# Patient Record
Sex: Male | Born: 1950 | Race: White | Hispanic: No | Marital: Married | Smoking: Never smoker
Health system: Southern US, Community
[De-identification: ages and names within clinical notes are randomized; demographics above are authoritative.]

## PROBLEM LIST (undated history)

## (undated) DIAGNOSIS — F419 Anxiety disorder, unspecified: Secondary | ICD-10-CM

## (undated) DIAGNOSIS — M1990 Unspecified osteoarthritis, unspecified site: Secondary | ICD-10-CM

## (undated) DIAGNOSIS — T8453XA Infection and inflammatory reaction due to internal right knee prosthesis, initial encounter: Secondary | ICD-10-CM

## (undated) DIAGNOSIS — L111 Transient acantholytic dermatosis [Grover]: Secondary | ICD-10-CM

## (undated) DIAGNOSIS — L853 Xerosis cutis: Secondary | ICD-10-CM

## (undated) DIAGNOSIS — K219 Gastro-esophageal reflux disease without esophagitis: Secondary | ICD-10-CM

## (undated) HISTORY — DX: Infection and inflammatory reaction due to internal right knee prosthesis, initial encounter: T84.53XA

## (undated) HISTORY — PX: SHOULDER SURGERY: SHX246

## (undated) HISTORY — PX: COLONOSCOPY: SHX174

## (undated) HISTORY — PX: HIP SURGERY: SHX245

## (undated) HISTORY — PX: VASECTOMY: SHX75

## (undated) HISTORY — DX: Transient acantholytic dermatosis (grover): L11.1

---
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
## 1989-01-20 HISTORY — PX: BACK SURGERY: SHX140

## 1998-08-28 ENCOUNTER — Ambulatory Visit (HOSPITAL_COMMUNITY): Admission: RE | Admit: 1998-08-28 | Discharge: 1998-08-28 | Payer: Self-pay | Admitting: Neurosurgery

## 1998-08-28 ENCOUNTER — Encounter: Payer: Self-pay | Admitting: Neurosurgery

## 1998-11-09 ENCOUNTER — Encounter: Admission: RE | Admit: 1998-11-09 | Discharge: 1999-02-07 | Payer: Self-pay | Admitting: Anesthesiology

## 2013-03-04 ENCOUNTER — Other Ambulatory Visit: Payer: Self-pay | Admitting: Orthopedic Surgery

## 2013-03-04 DIAGNOSIS — M179 Osteoarthritis of knee, unspecified: Secondary | ICD-10-CM

## 2013-03-04 DIAGNOSIS — M1710 Unilateral primary osteoarthritis, unspecified knee: Secondary | ICD-10-CM

## 2013-03-11 ENCOUNTER — Ambulatory Visit
Admission: RE | Admit: 2013-03-11 | Discharge: 2013-03-11 | Disposition: A | Discharge status: Home / Self Care | Payer: Commercial Managed Care - HMO | Source: Ambulatory Visit | Attending: Orthopedic Surgery | Admitting: Orthopedic Surgery

## 2013-03-11 DIAGNOSIS — M179 Osteoarthritis of knee, unspecified: Secondary | ICD-10-CM

## 2013-03-11 DIAGNOSIS — M1710 Unilateral primary osteoarthritis, unspecified knee: Secondary | ICD-10-CM

## 2013-04-06 HISTORY — PX: MEDIAL PARTIAL KNEE REPLACEMENT: SHX5965

## 2013-04-21 ENCOUNTER — Other Ambulatory Visit: Payer: Self-pay | Admitting: Orthopedic Surgery

## 2013-04-21 NOTE — Pre-Procedure Instructions (Signed)
RYKIN ROUTE  04/21/2013   Your procedure is scheduled on:  Saturday, April 23, 2013 at 7:30 AM.   Report to [facility] Emergency Registration at 6:00 AM.   Call this number if you have problems the morning of surgery: [phone]   Remember:   Do not eat food or drink liquids after midnight tonight.   Take these medicines the morning of surgery with A SIP OF WATER:    Do not wear jewelry, make-up or nail polish.  Do not wear lotions, powders, or perfumes. You may wear deodorant.  Do not shave 48 hours prior to surgery. Men may shave face and neck.  Do not bring valuables to the hospital.  [facility] is not responsible                  for any belongings or valuables.               Contacts, dentures or bridgework may not be worn into surgery.  Leave suitcase in the car. After surgery it may be brought to your room.  For patients admitted to the hospital, discharge time is determined by your                treatment team.               Patients discharged the day of surgery will not be allowed to drive  home.  Name and phone number of your driver: Family/friend   Special Instructions: [place] - Preparing for Surgery  Before surgery, you can play an important role.  Because skin is not sterile, your skin needs to be as free of germs as possible.  You can reduce the number of germs on you skin by washing with CHG (chlorahexidine gluconate) soap before surgery.  CHG is an antiseptic cleaner which kills germs and bonds with the skin to continue killing germs even after washing.  Please DO NOT use if you have an allergy to CHG or antibacterial soaps.  If your skin becomes reddened/irritated stop using the CHG and inform your nurse when you arrive at Short Stay.  Do not shave (including legs and underarms) for at least 48 hours prior to the first CHG shower.  You may shave your face.  Please follow these instructions carefully:   1.  Shower with CHG Soap the night before  surgery and the                                morning of Surgery.  2.  If you choose to wash your hair, wash your hair first as usual with your       normal shampoo.  3.  After you shampoo, rinse your hair and body thoroughly to remove the                      Shampoo.  4.  Use CHG as you would any other liquid soap.  You can apply chg directly       to the skin and wash gently with scrungie or a clean washcloth.  5.  Apply the CHG Soap to your body ONLY FROM THE NECK DOWN.        Do not use on open wounds or open sores.  Avoid contact with your eyes, ears, mouth and genitals (private parts).  Wash genitals (private parts) with your normal soap.  6.  Wash thoroughly, paying special attention to the area where your surgery        will be performed.  7.  Thoroughly rinse your body with warm water from the neck down.  8.  DO NOT shower/wash with your normal soap after using and rinsing off       the CHG Soap.  9.  Pat yourself dry with a clean towel.            10.  Wear clean pajamas.            11.  Place clean sheets on your bed the night of your first shower and do not        sleep with pets.  Day of Surgery  Do not apply any lotions the morning of surgery.  Please wear clean clothes to the hospital/surgery center.     Please read over the following fact sheets that you were given: Pain Booklet, Coughing and Deep Breathing and Surgical Site Infection Prevention

## 2013-04-22 ENCOUNTER — Encounter (HOSPITAL_COMMUNITY)
Admission: RE | Admit: 2013-04-22 | Discharge: 2013-04-22 | Disposition: A | Discharge status: Home / Self Care | Payer: Medicare HMO | Source: Ambulatory Visit | Attending: Orthopedic Surgery | Admitting: Orthopedic Surgery

## 2013-04-22 ENCOUNTER — Encounter (HOSPITAL_COMMUNITY): Payer: Self-pay

## 2013-04-22 DIAGNOSIS — Z01812 Encounter for preprocedural laboratory examination: Secondary | ICD-10-CM

## 2013-04-22 HISTORY — DX: Unspecified osteoarthritis, unspecified site: M19.90

## 2013-04-22 HISTORY — DX: Xerosis cutis: L85.3

## 2013-04-22 HISTORY — DX: Gastro-esophageal reflux disease without esophagitis: K21.9

## 2013-04-22 HISTORY — DX: Anxiety disorder, unspecified: F41.9

## 2013-04-22 LAB — CBC
HCT: 40.7 % (ref 39.0–52.0)
HEMOGLOBIN: 14.9 g/dL (ref 13.0–17.0)
MCH: 32.6 pg (ref 26.0–34.0)
MCHC: 36.6 g/dL — AB (ref 30.0–36.0)
MCV: 89.1 fL (ref 78.0–100.0)
Platelets: 392 10*3/uL (ref 150–400)
RBC: 4.57 MIL/uL (ref 4.22–5.81)
RDW: 13 % (ref 11.5–15.5)
WBC: 10 10*3/uL (ref 4.0–10.5)

## 2013-04-22 LAB — BASIC METABOLIC PANEL
BUN: 12 mg/dL (ref 6–23)
CO2: 16 mEq/L — ABNORMAL LOW (ref 19–32)
Calcium: 9.4 mg/dL (ref 8.4–10.5)
Chloride: 97 mEq/L (ref 96–112)
Creatinine, Ser: 0.84 mg/dL (ref 0.50–1.35)
GFR calc Af Amer: >90 mL/min (ref >90)
GLUCOSE: 150 mg/dL — AB (ref 70–99)
POTASSIUM: 3.8 meq/L (ref 3.7–5.3)
Sodium: 133 mEq/L — ABNORMAL LOW (ref 137–147)

## 2013-04-22 NOTE — Pre-Procedure Instructions (Signed)
Ricky Davis  04/22/2013   Your procedure is scheduled on:  Saturday, April 23, 2013 at 7:30 AM.   Report to [facility] Emergency Registration at 6:00 AM.   Call this number if you have problems the morning of surgery: [phone]   Remember:   Do not eat food or drink liquids after midnight tonight.   Take these medicines the morning of surgery with A SIP OF WATER: FLUoxetine (PROZAC), oxyCODONE (OXY IR/ROXICODONE) - if needed.     Do not wear jewelry, make-up or nail polish.  Do not wear lotions, powders, or perfumes. You may wear deodorant.  Do not shave 48 hours prior to surgery. Men may shave face and neck.  Do not bring valuables to the hospital.  [facility] is not responsible                  for any belongings or valuables.               Contacts, dentures or bridgework may not be worn into surgery.  Leave suitcase in the car. After surgery it may be brought to your room.  For patients admitted to the hospital, discharge time is determined by your                treatment team.               Patients discharged the day of surgery will not be allowed to drive  home.  Name and phone number of your driver: Family/friend   Special Instructions: [place] - Preparing for Surgery  Before surgery, you can play an important role.  Because skin is not sterile, your skin needs to be as free of germs as possible.  You can reduce the number of germs on you skin by washing with CHG (chlorahexidine gluconate) soap before surgery.  CHG is an antiseptic cleaner which kills germs and bonds with the skin to continue killing germs even after washing.  Please DO NOT use if you have an allergy to CHG or antibacterial soaps.  If your skin becomes reddened/irritated stop using the CHG and inform your nurse when you arrive at Short Stay.  Do not shave (including legs and underarms) for at least 48 hours prior to the first CHG shower.  You may shave your face.  Please follow these  instructions carefully:   1.  Shower with CHG Soap the night before surgery and the                                morning of Surgery.  2.  If you choose to wash your hair, wash your hair first as usual with your       normal shampoo.  3.  After you shampoo, rinse your hair and body thoroughly to remove the                      Shampoo.  4.  Use CHG as you would any other liquid soap.  You can apply chg directly       to the skin and wash gently with scrungie or a clean washcloth.  5.  Apply the CHG Soap to your body ONLY FROM THE NECK DOWN.        Do not use on open wounds or open sores.  Avoid contact with your eyes, ears, mouth and genitals (private parts).  Wash  genitals (private parts) with your normal soap.  6.  Wash thoroughly, paying special attention to the area where your surgery        will be performed.  7.  Thoroughly rinse your body with warm water from the neck down.  8.  DO NOT shower/wash with your normal soap after using and rinsing off       the CHG Soap.  9.  Pat yourself dry with a clean towel.            10.  Wear clean pajamas.            11.  Place clean sheets on your bed the night of your first shower and do not        sleep with pets.  Day of Surgery  Do not apply any lotions the morning of surgery.  Please wear clean clothes to the hospital/surgery center.     Please read over the following fact sheets that you were given: Pain Booklet, Coughing and Deep Breathing and Surgical Site Infection Prevention

## 2013-04-22 NOTE — Progress Notes (Signed)
Pt arrived to PAT at 9 AM in severe pain, unable to sit still, feeling nauseated, shaking. States that his right knee is draining, wife changed dressing this AM around 7:30 and pant leg around knee is wet. Pt states he was not able to sleep last pm at all due to the pain. Took a Oxycodone before 7 AM and no relief from pain. Also states he's taken his Cipro which he started yesterday. I called Dr. Ronnie Derby who was in surgery here at the hospital and he stated that his PA would come by PAT and see pt. We were able to finish the PAT interview and pt was very uncomfortable the entire time. I gave him an ice pack and he sat in the recliner for a while and then back in the wheelchair. Linton Rump, [place] with Dr. Ronnie Derby came and instructed pt to come to the office as soon as pt was finished here and he would assess and possibly drain pt's knee. Pt and voiced understanding. Pt getting labs drawn and will go to Dr. Ruel Favors office from here.

## 2013-04-22 NOTE — Progress Notes (Signed)
Pt denies having any cardiac hx, chest pain or sob.  Pt's PCP is Dr. Chancy Milroy at [facility] in [place], [place]

## 2013-04-23 ENCOUNTER — Encounter (HOSPITAL_COMMUNITY): Payer: Medicare HMO | Admitting: Anesthesiology

## 2013-04-23 ENCOUNTER — Encounter (HOSPITAL_COMMUNITY): Payer: Self-pay | Admitting: *Deleted

## 2013-04-23 ENCOUNTER — Ambulatory Visit (HOSPITAL_COMMUNITY): Payer: Medicare HMO | Admitting: Anesthesiology

## 2013-04-23 ENCOUNTER — Inpatient Hospital Stay (HOSPITAL_COMMUNITY)
Admission: RE | Admit: 2013-04-23 | Discharge: 2013-04-25 | DRG: 560 | Disposition: A | Discharge status: Home Health | Payer: Medicare HMO | Source: Ambulatory Visit | Attending: Orthopedic Surgery | Admitting: Orthopedic Surgery

## 2013-04-23 ENCOUNTER — Encounter (HOSPITAL_COMMUNITY)
Admission: RE | Disposition: A | Discharge status: Home Health | Payer: Self-pay | Source: Ambulatory Visit | Attending: Orthopedic Surgery

## 2013-04-23 DIAGNOSIS — F1020 Alcohol dependence, uncomplicated: Secondary | ICD-10-CM

## 2013-04-23 DIAGNOSIS — D62 Acute posthemorrhagic anemia: Secondary | ICD-10-CM | POA: Diagnosis not present

## 2013-04-23 DIAGNOSIS — T8453XA Infection and inflammatory reaction due to internal right knee prosthesis, initial encounter: Secondary | ICD-10-CM | POA: Insufficient documentation

## 2013-04-23 DIAGNOSIS — Z96659 Presence of unspecified artificial knee joint: Secondary | ICD-10-CM

## 2013-04-23 DIAGNOSIS — A4901 Methicillin susceptible Staphylococcus aureus infection, unspecified site: Secondary | ICD-10-CM | POA: Diagnosis present

## 2013-04-23 DIAGNOSIS — Y831 Surgical operation with implant of artificial internal device as the cause of abnormal reaction of the patient, or of later complication, without mention of misadventure at the time of the procedure: Secondary | ICD-10-CM | POA: Diagnosis present

## 2013-04-23 DIAGNOSIS — M25569 Pain in unspecified knee: Secondary | ICD-10-CM | POA: Diagnosis present

## 2013-04-23 DIAGNOSIS — T8450XA Infection and inflammatory reaction due to unspecified internal joint prosthesis, initial encounter: Secondary | ICD-10-CM | POA: Diagnosis present

## 2013-04-23 DIAGNOSIS — F411 Generalized anxiety disorder: Secondary | ICD-10-CM | POA: Diagnosis present

## 2013-04-23 DIAGNOSIS — K219 Gastro-esophageal reflux disease without esophagitis: Secondary | ICD-10-CM | POA: Diagnosis present

## 2013-04-23 DIAGNOSIS — M129 Arthropathy, unspecified: Secondary | ICD-10-CM | POA: Diagnosis present

## 2013-04-23 DIAGNOSIS — L089 Local infection of the skin and subcutaneous tissue, unspecified: Secondary | ICD-10-CM

## 2013-04-23 DIAGNOSIS — Y849 Medical procedure, unspecified as the cause of abnormal reaction of the patient, or of later complication, without mention of misadventure at the time of the procedure: Secondary | ICD-10-CM

## 2013-04-23 DIAGNOSIS — L738 Other specified follicular disorders: Secondary | ICD-10-CM | POA: Diagnosis present

## 2013-04-23 DIAGNOSIS — Z113 Encounter for screening for infections with a predominantly sexual mode of transmission: Secondary | ICD-10-CM

## 2013-04-23 HISTORY — PX: I&D KNEE WITH POLY EXCHANGE: SHX5024

## 2013-04-23 LAB — GRAM STAIN

## 2013-04-23 SURGERY — IRRIGATION AND DEBRIDEMENT KNEE WITH POLY EXCHANGE
Anesthesia: General | Site: Knee | Laterality: Right

## 2013-04-23 MED ORDER — METOCLOPRAMIDE HCL 5 MG PO TABS
5.0000 mg | ORAL_TABLET | Freq: Three times a day (TID) | ORAL | Status: DC | PRN
  Filled 2013-04-23: qty 2

## 2013-04-23 MED ORDER — MIDAZOLAM HCL 5 MG/5ML IJ SOLN
INTRAMUSCULAR | Status: DC | PRN
  Administered 2013-04-23: 2 mg via INTRAVENOUS

## 2013-04-23 MED ORDER — BISACODYL 5 MG PO TBEC
5.0000 mg | DELAYED_RELEASE_TABLET | Freq: Every day | ORAL | Status: DC | PRN
  Administered 2013-04-24: 5 mg via ORAL
  Filled 2013-04-23: qty 1

## 2013-04-23 MED ORDER — CEFAZOLIN SODIUM-DEXTROSE 2-3 GM-% IV SOLR
2.0000 g | Freq: Four times a day (QID) | INTRAVENOUS | Status: AC
  Administered 2013-04-23 (×2): 2 g via INTRAVENOUS
  Filled 2013-04-23 (×2): qty 50

## 2013-04-23 MED ORDER — METOCLOPRAMIDE HCL 5 MG/ML IJ SOLN: 5.0000 mg | Freq: Three times a day (TID) | INTRAMUSCULAR | Status: DC | PRN

## 2013-04-23 MED ORDER — SODIUM CHLORIDE 0.9 % IR SOLN
Status: DC | PRN
  Administered 2013-04-23: 5000 mL

## 2013-04-23 MED ORDER — MENTHOL 3 MG MT LOZG: 1.0000 | LOZENGE | OROMUCOSAL | Status: DC | PRN

## 2013-04-23 MED ORDER — DOCUSATE SODIUM 100 MG PO CAPS
100.0000 mg | ORAL_CAPSULE | Freq: Two times a day (BID) | ORAL | Status: DC
  Administered 2013-04-23 – 2013-04-25 (×4): 100 mg via ORAL
  Filled 2013-04-23 (×4): qty 1

## 2013-04-23 MED ORDER — SODIUM CHLORIDE 0.9 % IJ SOLN
INTRAMUSCULAR | Status: AC
  Filled 2013-04-23: qty 10

## 2013-04-23 MED ORDER — CHLORHEXIDINE GLUCONATE 4 % EX LIQD
60.0000 mL | Freq: Once | CUTANEOUS | Status: DC
  Filled 2013-04-23: qty 60

## 2013-04-23 MED ORDER — METHOCARBAMOL 100 MG/ML IJ SOLN
500.0000 mg | Freq: Four times a day (QID) | INTRAVENOUS | Status: DC | PRN
  Filled 2013-04-23: qty 5

## 2013-04-23 MED ORDER — METHOCARBAMOL 500 MG PO TABS
ORAL_TABLET | ORAL | Status: AC
  Administered 2013-04-23: 500 mg via ORAL
  Filled 2013-04-23: qty 1

## 2013-04-23 MED ORDER — ACETAMINOPHEN 325 MG PO TABS
650.0000 mg | ORAL_TABLET | Freq: Four times a day (QID) | ORAL | Status: DC | PRN
Start: 2013-04-23 — End: 2013-04-25
  Administered 2013-04-23: 650 mg via ORAL

## 2013-04-23 MED ORDER — OXYCODONE HCL 5 MG PO TABS
5.0000 mg | ORAL_TABLET | ORAL | Status: DC | PRN
  Administered 2013-04-23 – 2013-04-25 (×7): 10 mg via ORAL
  Filled 2013-04-23 (×6): qty 2

## 2013-04-23 MED ORDER — SUFENTANIL CITRATE 50 MCG/ML IV SOLN
INTRAVENOUS | Status: AC
  Filled 2013-04-23: qty 1

## 2013-04-23 MED ORDER — DEXTROSE 5 % IV SOLN
3.0000 g | INTRAVENOUS | Status: AC
  Administered 2013-04-23: 3 g via INTRAVENOUS
  Filled 2013-04-23: qty 3000

## 2013-04-23 MED ORDER — ASPIRIN EC 325 MG PO TBEC
325.0000 mg | DELAYED_RELEASE_TABLET | Freq: Every day | ORAL | Status: DC
  Administered 2013-04-24 – 2013-04-25 (×2): 325 mg via ORAL
  Filled 2013-04-23 (×3): qty 1

## 2013-04-23 MED ORDER — OXYCODONE HCL 5 MG PO TABS
ORAL_TABLET | ORAL | Status: AC
  Administered 2013-04-23: 10 mg via ORAL
  Filled 2013-04-23: qty 2

## 2013-04-23 MED ORDER — TRIAMCINOLONE ACETONIDE 0.1 % EX CREA
1.0000 "application " | TOPICAL_CREAM | Freq: Two times a day (BID) | CUTANEOUS | Status: DC | PRN
  Filled 2013-04-23: qty 15

## 2013-04-23 MED ORDER — ACETAMINOPHEN 325 MG PO TABS
ORAL_TABLET | ORAL | Status: AC
  Administered 2013-04-23: 650 mg via ORAL
  Filled 2013-04-23: qty 2

## 2013-04-23 MED ORDER — PROPOFOL 10 MG/ML IV BOLUS
INTRAVENOUS | Status: AC
  Filled 2013-04-23: qty 20

## 2013-04-23 MED ORDER — PHENOL 1.4 % MT LIQD
1.0000 | OROMUCOSAL | Status: DC | PRN
Start: 2013-04-23 — End: 2013-04-25

## 2013-04-23 MED ORDER — ONDANSETRON HCL 4 MG/2ML IJ SOLN: 4.0000 mg | Freq: Four times a day (QID) | INTRAMUSCULAR | Status: DC | PRN

## 2013-04-23 MED ORDER — OXYCODONE HCL ER 10 MG PO T12A
10.0000 mg | EXTENDED_RELEASE_TABLET | Freq: Two times a day (BID) | ORAL | Status: DC
  Administered 2013-04-23 – 2013-04-25 (×5): 10 mg via ORAL
  Filled 2013-04-23 (×5): qty 1

## 2013-04-23 MED ORDER — PROPOFOL 10 MG/ML IV BOLUS
INTRAVENOUS | Status: DC | PRN
  Administered 2013-04-23: 200 mg via INTRAVENOUS

## 2013-04-23 MED ORDER — RIFAMPIN 300 MG PO CAPS
300.0000 mg | ORAL_CAPSULE | Freq: Two times a day (BID) | ORAL | Status: DC
  Administered 2013-04-23 – 2013-04-25 (×5): 300 mg via ORAL
  Filled 2013-04-23 (×6): qty 1

## 2013-04-23 MED ORDER — SUFENTANIL CITRATE 50 MCG/ML IV SOLN
INTRAVENOUS | Status: DC | PRN
  Administered 2013-04-23: 5 ug via INTRAVENOUS
  Administered 2013-04-23: 10 ug via INTRAVENOUS
  Administered 2013-04-23: 5 ug via INTRAVENOUS
  Administered 2013-04-23: 10 ug via INTRAVENOUS
  Administered 2013-04-23 (×4): 5 ug via INTRAVENOUS

## 2013-04-23 MED ORDER — HYDROMORPHONE HCL PF 1 MG/ML IJ SOLN
0.2500 mg | INTRAMUSCULAR | Status: DC | PRN
  Administered 2013-04-23 (×2): 0.5 mg via INTRAVENOUS

## 2013-04-23 MED ORDER — BUPIVACAINE LIPOSOME 1.3 % IJ SUSP
20.0000 mL | Freq: Once | INTRAMUSCULAR | Status: DC
  Filled 2013-04-23: qty 20

## 2013-04-23 MED ORDER — HYDROMORPHONE HCL PF 1 MG/ML IJ SOLN
INTRAMUSCULAR | Status: AC
  Administered 2013-04-23: 0.5 mg via INTRAVENOUS
  Filled 2013-04-23: qty 1

## 2013-04-23 MED ORDER — PHENYLEPHRINE 40 MCG/ML (10ML) SYRINGE FOR IV PUSH (FOR BLOOD PRESSURE SUPPORT)
PREFILLED_SYRINGE | INTRAVENOUS | Status: AC
  Filled 2013-04-23: qty 10

## 2013-04-23 MED ORDER — IBUPROFEN 800 MG PO TABS
800.0000 mg | ORAL_TABLET | Freq: Three times a day (TID) | ORAL | Status: DC
  Filled 2013-04-23 (×2): qty 1

## 2013-04-23 MED ORDER — TRANEXAMIC ACID 100 MG/ML IV SOLN
1000.0000 mg | INTRAVENOUS | Status: AC
  Administered 2013-04-23: 1000 mg via INTRAVENOUS
  Filled 2013-04-23: qty 10

## 2013-04-23 MED ORDER — ONDANSETRON HCL 4 MG/2ML IJ SOLN
INTRAMUSCULAR | Status: DC | PRN
  Administered 2013-04-23: 4 mg via INTRAVENOUS

## 2013-04-23 MED ORDER — DIPHENHYDRAMINE HCL 12.5 MG/5ML PO ELIX: 12.5000 mg | ORAL_SOLUTION | ORAL | Status: DC | PRN

## 2013-04-23 MED ORDER — MIDAZOLAM HCL 2 MG/2ML IJ SOLN
INTRAMUSCULAR | Status: AC
  Filled 2013-04-23: qty 2

## 2013-04-23 MED ORDER — SODIUM CHLORIDE 0.9 % IJ SOLN
10.0000 mL | INTRAMUSCULAR | Status: DC | PRN
  Administered 2013-04-24 – 2013-04-25 (×3): 10 mL

## 2013-04-23 MED ORDER — SENNOSIDES-DOCUSATE SODIUM 8.6-50 MG PO TABS: 1.0000 | ORAL_TABLET | Freq: Every evening | ORAL | Status: DC | PRN

## 2013-04-23 MED ORDER — FLEET ENEMA 7-19 GM/118ML RE ENEM: 1.0000 | ENEMA | Freq: Once | RECTAL | Status: AC | PRN

## 2013-04-23 MED ORDER — ACETAMINOPHEN 650 MG RE SUPP: 650.0000 mg | Freq: Four times a day (QID) | RECTAL | Status: DC | PRN

## 2013-04-23 MED ORDER — VANCOMYCIN HCL IN DEXTROSE 1-5 GM/200ML-% IV SOLN
1000.0000 mg | Freq: Two times a day (BID) | INTRAVENOUS | Status: DC
  Administered 2013-04-23 – 2013-04-24 (×3): 1000 mg via INTRAVENOUS
  Filled 2013-04-23 (×5): qty 200

## 2013-04-23 MED ORDER — SODIUM CHLORIDE 0.9 % IV SOLN: INTRAVENOUS | Status: DC

## 2013-04-23 MED ORDER — ALUM & MAG HYDROXIDE-SIMETH 200-200-20 MG/5ML PO SUSP: 30.0000 mL | ORAL | Status: DC | PRN

## 2013-04-23 MED ORDER — HYDROMORPHONE HCL PF 1 MG/ML IJ SOLN
1.0000 mg | INTRAMUSCULAR | Status: DC | PRN
  Administered 2013-04-24: 1 mg via INTRAVENOUS
  Filled 2013-04-23: qty 1

## 2013-04-23 MED ORDER — BUPIVACAINE-EPINEPHRINE 0.5% -1:200000 IJ SOLN
INTRAMUSCULAR | Status: DC | PRN
  Administered 2013-04-23: 30 mL

## 2013-04-23 MED ORDER — FLUOXETINE HCL 20 MG PO CAPS
40.0000 mg | ORAL_CAPSULE | Freq: Every day | ORAL | Status: DC
  Administered 2013-04-24 – 2013-04-25 (×2): 40 mg via ORAL
  Filled 2013-04-23 (×2): qty 2

## 2013-04-23 MED ORDER — METHOCARBAMOL 500 MG PO TABS
500.0000 mg | ORAL_TABLET | Freq: Four times a day (QID) | ORAL | Status: DC | PRN
  Administered 2013-04-23 – 2013-04-25 (×7): 500 mg via ORAL
  Filled 2013-04-23 (×5): qty 1

## 2013-04-23 MED ORDER — BUPIVACAINE-EPINEPHRINE (PF) 0.5% -1:200000 IJ SOLN
INTRAMUSCULAR | Status: AC
  Filled 2013-04-23: qty 10

## 2013-04-23 MED ORDER — PHENYLEPHRINE HCL 10 MG/ML IJ SOLN
INTRAMUSCULAR | Status: DC | PRN
  Administered 2013-04-23 (×2): 80 ug via INTRAVENOUS

## 2013-04-23 MED ORDER — SODIUM CHLORIDE 0.9 % IV SOLN
INTRAVENOUS | Status: DC
Start: 2013-04-23 — End: 2013-04-25
  Administered 2013-04-23: 11:00:00 via INTRAVENOUS

## 2013-04-23 MED ORDER — 0.9 % SODIUM CHLORIDE (POUR BTL) OPTIME
TOPICAL | Status: DC | PRN
  Administered 2013-04-23: 1000 mL

## 2013-04-23 MED ORDER — LIDOCAINE HCL (CARDIAC) 20 MG/ML IV SOLN
INTRAVENOUS | Status: DC | PRN
  Administered 2013-04-23: 60 mg via INTRAVENOUS

## 2013-04-23 MED ORDER — LACTATED RINGERS IV SOLN
INTRAVENOUS | Status: DC | PRN
  Administered 2013-04-23 (×2): via INTRAVENOUS

## 2013-04-23 MED ORDER — LIDOCAINE HCL (CARDIAC) 20 MG/ML IV SOLN
INTRAVENOUS | Status: AC
  Filled 2013-04-23: qty 5

## 2013-04-23 MED ORDER — BUPIVACAINE LIPOSOME 1.3 % IJ SUSP
INTRAMUSCULAR | Status: DC | PRN
  Administered 2013-04-23: 20 mL

## 2013-04-23 MED ORDER — VANCOMYCIN HCL IN DEXTROSE 1-5 GM/200ML-% IV SOLN
1000.0000 mg | Freq: Once | INTRAVENOUS | Status: AC
  Administered 2013-04-23: 1000 mg via INTRAVENOUS
  Filled 2013-04-23: qty 200

## 2013-04-23 MED ORDER — ONDANSETRON HCL 4 MG/2ML IJ SOLN
INTRAMUSCULAR | Status: AC
  Filled 2013-04-23: qty 2

## 2013-04-23 MED ORDER — ONDANSETRON HCL 4 MG PO TABS: 4.0000 mg | ORAL_TABLET | Freq: Four times a day (QID) | ORAL | Status: DC | PRN

## 2013-04-23 SURGICAL SUPPLY — 66 items
BAG URINE DRAINAGE (UROLOGICAL SUPPLIES) IMPLANT
BANDAGE ELASTIC 4 VELCRO ST LF (GAUZE/BANDAGES/DRESSINGS) ×2 IMPLANT
BANDAGE ELASTIC 6 VELCRO ST LF (GAUZE/BANDAGES/DRESSINGS) ×2 IMPLANT
BANDAGE ESMARK 6X9 LF (GAUZE/BANDAGES/DRESSINGS) ×1 IMPLANT
BNDG CMPR 9X6 STRL LF SNTH (GAUZE/BANDAGES/DRESSINGS) ×1
BNDG ESMARK 6X9 LF (GAUZE/BANDAGES/DRESSINGS) ×3
BOWL SMART MIX CTS (DISPOSABLE) ×1 IMPLANT
CATH KIT ON Q 5IN SLV (PAIN MANAGEMENT) ×1 IMPLANT
CLOSURE WOUND 1/2 X4 (GAUZE/BANDAGES/DRESSINGS) ×1
CONT SPEC 4OZ CLIKSEAL STRL BL (MISCELLANEOUS) ×2 IMPLANT
COVER SURGICAL LIGHT HANDLE (MISCELLANEOUS) ×3 IMPLANT
CUFF TOURNIQUET SINGLE 34IN LL (TOURNIQUET CUFF) IMPLANT
DRAPE EXTREMITY T 121X128X90 (DRAPE) ×3 IMPLANT
DRAPE INCISE IOBAN 66X45 STRL (DRAPES) ×2 IMPLANT
DRAPE PROXIMA HALF (DRAPES) ×3 IMPLANT
DRAPE U-SHAPE 47X51 STRL (DRAPES) ×3 IMPLANT
DRSG ADAPTIC 3X8 NADH LF (GAUZE/BANDAGES/DRESSINGS) ×1 IMPLANT
DRSG PAD ABDOMINAL 8X10 ST (GAUZE/BANDAGES/DRESSINGS) ×5 IMPLANT
DURAPREP 26ML APPLICATOR (WOUND CARE) ×4 IMPLANT
ELECT REM PT RETURN 9FT ADLT (ELECTROSURGICAL) ×3
ELECTRODE REM PT RTRN 9FT ADLT (ELECTROSURGICAL) ×1 IMPLANT
EVACUATOR 1/8 PVC DRAIN (DRAIN) ×3 IMPLANT
GAUZE XEROFORM 1X8 LF (GAUZE/BANDAGES/DRESSINGS) ×2 IMPLANT
GLOVE BIOGEL M 7.0 STRL (GLOVE) IMPLANT
GLOVE BIOGEL PI IND STRL 7.5 (GLOVE) IMPLANT
GLOVE BIOGEL PI IND STRL 8.5 (GLOVE) ×2 IMPLANT
GLOVE BIOGEL PI INDICATOR 7.5 (GLOVE)
GLOVE BIOGEL PI INDICATOR 8.5 (GLOVE) ×4
GLOVE SURG ORTHO 8.0 STRL STRW (GLOVE) ×6 IMPLANT
GOWN STRL REUS W/ TWL LRG LVL3 (GOWN DISPOSABLE) ×2 IMPLANT
GOWN STRL REUS W/ TWL XL LVL3 (GOWN DISPOSABLE) ×2 IMPLANT
GOWN STRL REUS W/TWL LRG LVL3 (GOWN DISPOSABLE) ×3
GOWN STRL REUS W/TWL XL LVL3 (GOWN DISPOSABLE) ×6
HANDPIECE INTERPULSE COAX TIP (DISPOSABLE) ×3
HOOD PEEL AWAY FACE SHEILD DIS (HOOD) ×6 IMPLANT
KIT BASIN OR (CUSTOM PROCEDURE TRAY) ×3 IMPLANT
KIT ROOM TURNOVER OR (KITS) ×3 IMPLANT
KNEE UNICOMPART ARTISURF 8MM (Knees) ×2 IMPLANT
MANIFOLD NEPTUNE II (INSTRUMENTS) ×3 IMPLANT
NDL 18GX1X1/2 (RX/OR ONLY) (NEEDLE) IMPLANT
NEEDLE 18GX1X1/2 (RX/OR ONLY) (NEEDLE) ×3 IMPLANT
NS IRRIG 1000ML POUR BTL (IV SOLUTION) ×3 IMPLANT
PACK TOTAL JOINT (CUSTOM PROCEDURE TRAY) ×3 IMPLANT
PAD ARMBOARD 7.5X6 YLW CONV (MISCELLANEOUS) ×6 IMPLANT
PADDING CAST COTTON 6X4 STRL (CAST SUPPLIES) ×3 IMPLANT
SET HNDPC FAN SPRY TIP SCT (DISPOSABLE) ×1 IMPLANT
SPONGE GAUZE 4X4 12PLY (GAUZE/BANDAGES/DRESSINGS) ×3 IMPLANT
STAPLER VISISTAT 35W (STAPLE) ×1 IMPLANT
STRIP CLOSURE SKIN 1/2X4 (GAUZE/BANDAGES/DRESSINGS) ×1 IMPLANT
SUCTION FRAZIER TIP 10 FR DISP (SUCTIONS) ×3 IMPLANT
SUT MNCRL AB 3-0 PS2 18 (SUTURE) ×2 IMPLANT
SUT PDS AB 1 CTX 36 (SUTURE) ×4 IMPLANT
SUT PDS AB 1 CTXB1 36 (SUTURE) IMPLANT
SUT VIC AB 0 CTB1 27 (SUTURE) ×4 IMPLANT
SUT VIC AB 1 CT1 27 (SUTURE)
SUT VIC AB 1 CT1 27XBRD ANBCTR (SUTURE) IMPLANT
SUT VIC AB 2-0 CT1 27 (SUTURE) ×3
SUT VIC AB 2-0 CT1 TAPERPNT 27 (SUTURE) ×2 IMPLANT
SYR 20CC LL (SYRINGE) ×6 IMPLANT
SYRINGE 10CC LL (SYRINGE) IMPLANT
TOWEL OR 17X24 6PK STRL BLUE (TOWEL DISPOSABLE) ×3 IMPLANT
TOWEL OR 17X26 10 PK STRL BLUE (TOWEL DISPOSABLE) ×3 IMPLANT
TRAY FOLEY CATH 16FRSI W/METER (SET/KITS/TRAYS/PACK) IMPLANT
TRAY TIBIAL INSERTER TIP (Orthopedic Implant) ×2 IMPLANT
UNICOMPARTMENTAL KNEE SYSTEM ×2 IMPLANT
WATER STERILE IRR 1000ML POUR (IV SOLUTION) ×3 IMPLANT

## 2013-04-23 NOTE — Op Note (Addendum)
NAMEJAXTYN, Ricky Davis NO.:  [id number]  MEDICAL RECORD NO.:  [id number]  LOCATION:  [facility]                         FACILITY:  [facility]  PHYSICIAN:  Estill Bamberg. Ronnie Derby, M.D. DATE OF BIRTH:  25-Oct-1950  DATE OF PROCEDURE:  04/23/2013 DATE OF DISCHARGE:                              OPERATIVE REPORT   SURGEON:  Estill Bamberg. Ronnie Derby, MD  ASSISTANT:  Carlynn Spry PA-C  PREOPERATIVE DIAGNOSIS:  Right knee infection.  POSTOPERATIVE DIAGNOSIS:  Right knee infection.  PROCEDURE:  Right knee open irrigation and debridement of uni knee.  INDICATION FOR PROCEDURE:  The patient is a 63 year old white male who is 2 weeks status post partial knee.  He had 119,000 white blood cells in his aspirate.  Informed consent was obtained after good cultures.  DESCRIPTION OF PROCEDURE:  The patient was laid supine, administered general anesthesia.  Right leg prepped and draped in usual fashion. There was no exsanguination of the extremity due to infection.  I then made the midline incision.  There was lot of purulence in the subQ tissue between skin and joint; infected in the prepatellar area.  I debrided this carefully with rongeur and #10 blade and forceps.  I then irrigated with about 1500 mL of L.R.  I then made the incision along the patella tendon where there was a communication with the joint.  I then opened that completely.  I washed that with another 1500 mL of normal saline via pulse lavage. I then closed the capsule with #1 PDS subcuticular 2-0 Monocryl.  Steri-Strips, Xeroform dressing sponges, sterile Webril, and Ace wrap.  Tourniquet time was 40 minutes.  COMPLICATIONS:  None.  DRAINS:  None.          ______________________________ Estill Bamberg. Ronnie Derby, M.D.     SDL/MEDQ  D:  04/23/2013  T:  04/23/2013  Job:  [id number]

## 2013-04-23 NOTE — Anesthesia Postprocedure Evaluation (Signed)
  Anesthesia Post-op Note  Patient: Ricky Davis  Procedure(s) Performed: Procedure(s): IRRIGATION AND DEBRIDEMENT KNEE WITH POLY EXCHANGE (Right)  Patient Location: PACU  Anesthesia Type:General  Level of Consciousness: awake  Airway and Oxygen Therapy: Patient Spontanous Breathing  Post-op Pain: mild  Post-op Assessment: Post-op Vital signs reviewed  Post-op Vital Signs: Reviewed  Complications: No apparent anesthesia complications

## 2013-04-23 NOTE — Anesthesia Procedure Notes (Signed)
Procedure Name: LMA Insertion Date/Time: 04/23/2013 7:38 AM Performed by: Izora Gala Pre-anesthesia Checklist: Patient identified, Emergency Drugs available, Suction available and Patient being monitored Patient Re-evaluated:Patient Re-evaluated prior to inductionOxygen Delivery Method: Circle system utilized Preoxygenation: Pre-oxygenation with 100% oxygen Intubation Type: IV induction Ventilation: Mask ventilation without difficulty LMA: LMA inserted LMA Size: 4.0 Number of attempts: 1 Placement Confirmation: positive ETCO2 and breath sounds checked- equal and bilateral Tube secured with: Tape Dental Injury: Teeth and Oropharynx as per pre-operative assessment

## 2013-04-23 NOTE — Anesthesia Preprocedure Evaluation (Addendum)
Anesthesia Evaluation  Patient identified by MRN, date of birth, ID band Patient awake    Reviewed: Allergy & Precautions, H&P , NPO status   History of Anesthesia Complications Negative for: history of anesthetic complications  Airway Mallampati: I TM Distance: >3 FB Neck ROM: Full   Comment: Beard and moustache Dental   Pulmonary neg pulmonary ROS,  breath sounds clear to auscultation  Pulmonary exam normal       Cardiovascular negative cardio ROS  Rhythm:Regular     Neuro/Psych negative neurological ROS  negative psych ROS   GI/Hepatic Neg liver ROS, GERD-  ,Occasional beer Controlled by head elevation   Endo/Other  negative endocrine ROS  Renal/GU negative Renal ROS     Musculoskeletal   Abdominal   Peds  Hematology negative hematology ROS (+)   Anesthesia Other Findings   Reproductive/Obstetrics                          Anesthesia Physical Anesthesia Plan  ASA: II  Anesthesia Plan: General   Post-op Pain Management:    Induction: Intravenous  Airway Management Planned: LMA  Additional Equipment:   Intra-op Plan:   Post-operative Plan: Extubation in OR  Informed Consent:   Dental advisory given  Plan Discussed with:   Anesthesia Plan Comments:         Anesthesia Quick Evaluation

## 2013-04-23 NOTE — Progress Notes (Signed)
OT Cancellation and Discharge Note  Patient Details Name: Ricky Davis MRN: [id number] DOB: 09-Nov-1950   Cancelled Treatment:    Reason Eval/Treat Not Completed: OT screened, no needs identified, will sign off. Pt s/p R TKA about 2 weeks ago, now in for I&D for infection. Pt educated on role of OT and pt has no ADL concerns. Pt has been performing ADLs independently or with help from wife for the past 2 weeks and feels confident he can return to doing so after discharge. Opportunity offered to practice transfers or LB dressing, however pt declined. No further acute OT needs at this time; acute OT to sign off.   Juluis Rainier [phone] 04/23/2013, 4:59 PM

## 2013-04-23 NOTE — Consult Note (Signed)
[facility] for Infectious Disease    Date of Admission:  04/23/2013  Date of Consult:  04/23/2013  Reason for Consult: Prosthetic joint infection Referring Physician: Dr. Ronnie Derby   HPI: Ricky Davis is an 63 y.o. male with recent TKA on the right who developed drainage from knee after having a few weeks of knee pain. Joint was aspirated and cell count with >100K wbc, PMN, predominant. Culture reportedly NGTD, though I am having trouble finding it in "care everywhere." sp I and D and poly-exchange today and with new cultures sent.   Past Medical History  Diagnosis Date  . Anxiety     takin Fluoxetine  . GERD (gastroesophageal reflux disease)     no longer taking meds, elevates HOB  . Arthritis   . Dry skin     "winter itch", using Kenalog cream    Past Surgical History  Procedure Laterality Date  . Medial partial knee replacement Right 04/06/2013  . Vasectomy    . Colonoscopy    . Back surgery  1991    lumbar surgery  . Hamstring surgery  1958    to stretch hamstring  ergies:   No Known Allergies   Medications: I have reviewed patients current medications as documented in Epic Anti-infectives   Start     Dose/Rate Route Frequency Ordered Stop   04/23/13 2200  vancomycin (VANCOCIN) IVPB 1000 mg/200 mL premix     1,000 mg 200 mL/hr over 60 Minutes Intravenous Every 12 hours 04/23/13 1236     04/23/13 1400  ceFAZolin (ANCEF) IVPB 2 g/50 mL premix     2 g 100 mL/hr over 30 Minutes Intravenous Every 6 hours 04/23/13 1053 04/24/13 0159   04/23/13 1330  rifampin (RIFADIN) capsule 300 mg     300 mg Oral Every 12 hours 04/23/13 1229     04/23/13 1315  vancomycin (VANCOCIN) IVPB 1000 mg/200 mL premix     1,000 mg 200 mL/hr over 60 Minutes Intravenous  Once 04/23/13 1236     04/23/13 0631  ceFAZolin (ANCEF) 3 g in dextrose 5 % 50 mL IVPB     3 g 160 mL/hr over 30 Minutes Intravenous On call to O.R. 04/23/13 0631 04/23/13 0803      Social History:  reports that he  has never smoked. He has never used smokeless tobacco. He reports that he drinks alcohol. He reports that he does not use illicit drugs.  Family History  Problem Relation Age of Onset  . Alzheimer's disease Mother   . Hypertension Mother   . Alzheimer's disease Father     As in HPI and primary teams notes otherwise 12 point review of systems is negative  Blood pressure 122/79, pulse 97, temperature 98 F (36.7 C), temperature source Oral, resp. rate 18, weight 158 lb (71.668 kg), SpO2 98.00%. General: Alert and awake, oriented x3, not in any acute distress. HEENT: anicteric sclera, , EOMI, oropharynx clear and without exudate CVS regular rate, normal r,  no murmur rubs or gallops Chest: clear to auscultation bilaterally, no wheezing, rales or rhonchi Abdomen: soft nontender, nondistended, normal bowel sounds, Extremities: knee bandaged Skin: no rashes Neuro: nonfocal, strength and sensation intact   Results for orders placed during the hospital encounter of 04/23/13 (from the past 48 hour(s))  GRAM STAIN     Status: None   Collection Time    04/23/13  8:03 AM      Result Value Ref Range   Specimen Description SYNOVIAL  RIGHT FLUID KNEE     Special Requests NONE     Gram Stain       Value: FEW WBC PRESENT, PREDOMINANTLY PMN     NO ORGANISMS SEEN   Report Status 04/23/2013 FINAL    GRAM STAIN     Status: None   Collection Time    04/23/13  8:03 AM      Result Value Ref Range   Specimen Description WOUND RIGHT KNEE     Special Requests NONE     Gram Stain       Value: ABUNDANT WBC PRESENT,BOTH PMN AND MONONUCLEAR     NO ORGANISMS SEEN   Report Status 04/23/2013 FINAL        Component Value Date/Time   SDES SYNOVIAL RIGHT FLUID KNEE 04/23/2013 0803   SDES WOUND RIGHT KNEE 04/23/2013 0803   SPECREQUEST NONE 04/23/2013 0803   SPECREQUEST NONE 04/23/2013 0803   REPTSTATUS 04/23/2013 FINAL 04/23/2013 0803   REPTSTATUS 04/23/2013 FINAL 04/23/2013 0803   No results found.   Recent  Results (from the past 720 hour(s))  GRAM STAIN     Status: None   Collection Time    04/23/13  8:03 AM      Result Value Ref Range Status   Specimen Description SYNOVIAL RIGHT FLUID KNEE   Final   Special Requests NONE   Final   Gram Stain     Final   Value: FEW WBC PRESENT, PREDOMINANTLY PMN     NO ORGANISMS SEEN   Report Status 04/23/2013 FINAL   Final  GRAM STAIN     Status: None   Collection Time    04/23/13  8:03 AM      Result Value Ref Range Status   Specimen Description WOUND RIGHT KNEE   Final   Special Requests NONE   Final   Gram Stain     Final   Value: ABUNDANT WBC PRESENT,BOTH PMN AND MONONUCLEAR     NO ORGANISMS SEEN   Report Status 04/23/2013 FINAL   Final     Impression/Recommendation  Active Problems:   Knee pain, acute   Ricky Davis is a 63 y.o. 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  #1 Prosthetic joint infection  --start vancomycin for goal trough of 15-20 --start rifampin 300mg  bid  Followup cultures (at [facility] and here from OR --was on abx just prior to OR cultures here )  PICC line   Will need 6 weeks of IV abx,followed by po abx for 6 months minimum  #2 Screening: check HIV and Hep C  #3 Etoh use: 3 beers per day, will need to abstain as much as possible with rifampin on board   04/23/2013, 12:53 PM   Thank you so much for this interesting consult  [facility] [phone] (pager) [phone] (office) 04/23/2013, 12:53 PM  [place] 04/23/2013, 12:53 PM

## 2013-04-23 NOTE — Progress Notes (Signed)
Pt lying in bed, denies pain or needs at present. No acute distress noted at this time. Will continue to monitor pt.

## 2013-04-23 NOTE — Transfer of Care (Signed)
Immediate Anesthesia Transfer of Care Note  Patient: Ricky Davis  Procedure(s) Performed: Procedure(s): IRRIGATION AND DEBRIDEMENT KNEE WITH POLY EXCHANGE (Right)  Patient Location: PACU  Anesthesia Type:General  Level of Consciousness: awake, sedated and patient cooperative  Airway & Oxygen Therapy: Patient Spontanous Breathing and Patient connected to face mask oxygen  Post-op Assessment: Report given to PACU RN and Post -op Vital signs reviewed and stable  Post vital signs: Reviewed and stable  Complications: No apparent anesthesia complications

## 2013-04-23 NOTE — Progress Notes (Signed)
Orthopedic Tech Progress Note Patient Details:  Ricky Davis 1950/04/12 [id number]  Ortho Devices Ortho Device/Splint Location: foot roll Ortho Device/Splint Interventions: Application   Cammer, Theodoro Parma 04/23/2013, 11:53 AM

## 2013-04-23 NOTE — Progress Notes (Signed)
Peripherally Inserted Central Catheter/Midline Placement  The IV Nurse has discussed with the patient and/or persons authorized to consent for the patient, the purpose of this procedure and the potential benefits and risks involved with this procedure.  The benefits include less needle sticks, lab draws from the catheter and patient may be discharged home with the catheter.  Risks include, but not limited to, infection, bleeding, blood clot (thrombus formation), and puncture of an artery; nerve damage and irregular heat beat.  Alternatives to this procedure were also discussed.  PICC/Midline Placement Documentation  PICC / Midline Single Lumen 94/50/38 PICC Right Basilic 42 cm 0 cm (Active)  Indication for Insertion or Continuance of Line Home intravenous therapies (PICC only) 04/23/2013  3:31 PM  Exposed Catheter (cm) 0 cm 04/23/2013  3:31 PM  Site Assessment Clean;Dry;Intact 04/23/2013  3:31 PM  Line Status Flushed;Saline locked;Blood return noted 04/23/2013  3:31 PM  Dressing Type Transparent 04/23/2013  3:31 PM  Dressing Status Clean;Dry;Intact;Antimicrobial disc in place 04/23/2013  3:31 PM  Line Care Connections checked and tightened 04/23/2013  3:31 PM  Line Adjustment (NICU/IV Team Only) No 04/23/2013  3:31 PM  Dressing Intervention New dressing 04/23/2013  3:31 PM  Dressing Change Due 04/30/13 04/23/2013  3:31 PM       Rolena Infante 04/23/2013, 3:33 PM

## 2013-04-23 NOTE — Progress Notes (Signed)
CRITICAL VALUE ALERT  Critical value received:  Gram    Date of notification:  04/23/2013  Time of notification:  18:10  Critical value read back:yes  Nurse who received alert:    MD notified (1st page):  Dr. Percell Miller   Time of first page:    MD notified (2nd page):Dr. Ronnie Derby  Time of second page:  Responding MD:  Dr. Ronnie Derby   Time MD responded: 18:26

## 2013-04-23 NOTE — Progress Notes (Signed)
ANTIBIOTIC CONSULT NOTE - INITIAL  Pharmacy Consult for vancomycin Indication: Knee osteomyelitis  No Known Allergies  Patient Measurements: Weight: 158 lb (71.668 kg)  Vital Signs: Temp: 98 F (36.7 C) (04/04 1028) Temp src: Oral (04/04 0633) BP: 122/79 mmHg (04/04 1028) Pulse Rate: 97 (04/04 1028) Intake/Output from previous day:   Intake/Output from this shift: Total I/O In: 1000 [I.V.:1000] Out: -   Labs:  Recent Labs  04/22/13 0944  WBC 10.0  HGB 14.9  PLT 392  CREATININE 0.84   CrCl is unknown because there is no height on file for the current visit. No results found for this basename: VANCOTROUGH, VANCOPEAK, VANCORANDOM, GENTTROUGH, GENTPEAK, GENTRANDOM, TOBRATROUGH, TOBRAPEAK, TOBRARND, AMIKACINPEAK, AMIKACINTROU, AMIKACIN,  in the last 72 hours   Microbiology: Recent Results (from the past 720 hour(s))  GRAM STAIN     Status: None   Collection Time    04/23/13  8:03 AM      Result Value Ref Range Status   Specimen Description SYNOVIAL RIGHT FLUID KNEE   Final   Special Requests NONE   Final   Gram Stain     Final   Value: FEW WBC PRESENT, PREDOMINANTLY PMN     NO ORGANISMS SEEN   Report Status 04/23/2013 FINAL   Final  GRAM STAIN     Status: None   Collection Time    04/23/13  8:03 AM      Result Value Ref Range Status   Specimen Description WOUND RIGHT KNEE   Final   Special Requests NONE   Final   Gram Stain     Final   Value: ABUNDANT WBC PRESENT,BOTH PMN AND MONONUCLEAR     NO ORGANISMS SEEN   Report Status 04/23/2013 FINAL   Final    Medical History: Past Medical History  Diagnosis Date  . Anxiety     takin Fluoxetine  . GERD (gastroesophageal reflux disease)     no longer taking meds, elevates HOB  . Arthritis   . Dry skin     "winter itch", using Kenalog cream    Medications:  Prescriptions prior to admission  Medication Sig Dispense Refill  . ciprofloxacin (CIPRO) 750 MG tablet Take 750 mg by mouth 2 (two) times daily.  Starting 04/21/13 for 10 day course      . FLUoxetine (PROZAC) 40 MG capsule Take 40 mg by mouth daily.      Marland Kitchen ibuprofen (ADVIL,MOTRIN) 800 MG tablet Take 800 mg by mouth 3 (three) times daily.      Marland Kitchen oxyCODONE (OXY IR/ROXICODONE) 5 MG immediate release tablet Take 5 mg by mouth every 4 (four) hours as needed for severe pain.      Marland Kitchen triamcinolone cream (KENALOG) 0.1 % Apply 1 application topically 2 (two) times daily as needed (itching/rash).       Assessment: 63 year old man s/p I&D of right knee with poly exchange.  Patient had unicompartmental knee replacement of this knee two weeks ago.  Vancomycin and rifampin to start for prosthetic joint infection.  Goal of Therapy:  Vancomycin trough level 15-20 mcg/ml  Plan:  Measure antibiotic drug levels at steady state Follow up culture results Vancomycin 1g IV q12 Monitor renal function  Candie Mile 04/23/2013,12:36 PM

## 2013-04-23 NOTE — Evaluation (Signed)
Physical Therapy Evaluation Patient Details Name: Ricky Davis MRN: [id number] DOB: [birth year] Today's Date: 04/23/2013   History of Present Illness  Pt s/p TKA ~2 weeks ago, and developed infection in the surgical knee. Pt is now s/p I&D with poly exchange, WBAT.   Clinical Impression  This patient presents with acute pain and decreased functional independence following the above mentioned procedure. At the time of PT eval, pt was able to perform transfers and ambulation with supervision. This patient is appropriate for skilled PT interventions to address functional limitations, improve safety and independence with functional mobility, and return to PLOF.     Follow Up Recommendations Home health PT    Equipment Recommendations  None recommended by PT    Recommendations for Other Services       Precautions / Restrictions Precautions Precautions: Fall;Knee Precaution Comments: Discussed foam roll under heel and NO pillow under knee.  Restrictions Weight Bearing Restrictions: Yes RLE Weight Bearing: Weight bearing as tolerated      Mobility  Bed Mobility Overal bed mobility: Needs Assistance Bed Mobility: Supine to Sit     Supine to sit: Supervision     General bed mobility comments: Pt able to transition to EOB without assist. Pt demonstrated proper technique and safety awareness.   Transfers Overall transfer level: Needs assistance Equipment used: Rolling walker (2 wheeled) Transfers: Sit to/from Stand Sit to Stand: Supervision         General transfer comment: VC's for hand placement on seated surface for safety.   Ambulation/Gait Ambulation/Gait assistance: Supervision Ambulation Distance (Feet): 250 Feet Assistive device: Rolling walker (2 wheeled) Gait Pattern/deviations: Step-through pattern;Decreased stride length Gait velocity: Slightly decreased Gait velocity interpretation: Below normal speed for age/gender General Gait Details: VC's for walker  placement and general safety awareness. Will probably be ready for cane next session.   Stairs            Wheelchair Mobility    Modified Rankin (Stroke Patients Only)       Balance Overall balance assessment: No apparent balance deficits (not formally assessed)                                           Pertinent Vitals/Pain Pt reports decreased pain from prior to surgery.    Home Living Family/patient expects to be discharged to:: Private residence Living Arrangements: Spouse/significant other;Children Available Help at Discharge: Family;Available 24 hours/day Type of Home: House Home Access: Stairs to enter Entrance Stairs-Rails: Right;Left Entrance Stairs-Number of Steps: 5 Home Layout: Multi-level;1/2 bath on main level;Able to live on main level with bedroom/bathroom Home Equipment: Gilford Rile - 2 wheels;Bedside commode      Prior Function Level of Independence: Independent               Hand Dominance   Dominant Hand: Right    Extremity/Trunk Assessment   Upper Extremity Assessment: Defer to OT evaluation           Lower Extremity Assessment: RLE deficits/detail RLE Deficits / Details: Decreased strength and AROM consistent with above mentioned procedure.     Cervical / Trunk Assessment: Normal  Communication   Communication: No difficulties  Cognition Arousal/Alertness: Awake/alert Behavior During Therapy: WFL for tasks assessed/performed Overall Cognitive Status: Within Functional Limits for tasks assessed  General Comments General comments (skin integrity, edema, etc.): Issued HEP packet    Exercises Total Joint Exercises Ankle Circles/Pumps: 10 reps Quad Sets: 10 reps      Assessment/Plan    PT Assessment Patient needs continued PT services  PT Diagnosis Difficulty walking;Acute pain   PT Problem List Decreased strength;Decreased range of motion;Decreased activity  tolerance;Decreased balance;Decreased mobility;Decreased knowledge of use of DME;Decreased safety awareness;Pain;Decreased knowledge of precautions  PT Treatment Interventions DME instruction;Stair training;Gait training;Functional mobility training;Therapeutic activities;Therapeutic exercise;Neuromuscular re-education;Patient/family education   PT Goals (Current goals can be found in the Care Plan section) Acute Rehab PT Goals Patient Stated Goal: To return to PLOF PT Goal Formulation: With patient/family Time For Goal Achievement: 04/30/13 Potential to Achieve Goals: Good    Frequency 7X/week   Barriers to discharge        Co-evaluation               End of Session Equipment Utilized During Treatment: Gait belt Activity Tolerance: Patient tolerated treatment well Patient left: in chair;with call bell/phone within reach;with family/visitor present Nurse Communication: Mobility status         Time: 1204-1233 PT Time Calculation (min): 29 min   Charges:   PT Evaluation $Initial PT Evaluation Tier I: 1 Procedure PT Treatments $Gait Training: 8-22 mins   PT G CodesJolyn Lent 04/23/2013, 1:48 PM  Jolyn Lent, PT, DPT Acute Rehabilitation Services Pager: [phone]

## 2013-04-23 NOTE — Op Note (Signed)
Dictation Number: [id number]

## 2013-04-23 NOTE — H&P (Signed)
NAME: Ricky Davis MRN:   [id number] DOB:   18-Sep-1950     HISTORY AND PHYSICAL  CHIEF COMPLAINT:  Right knee pain and drainage  HISTORY:   Ricky Davis a 63 y.o. male  with right  Knee Pain Patient presents 2 weeks after a right unicompartmental knee replacement involving the right knee. Onset of the symptoms was several days ago. Inciting event: none known. Current symptoms include pain and drainage from wound.Patient's knee was drainage and packed with sterile guaze on 04/21/13 and 04/22/13.  Fluid was sent off showing high numbers of white blood cells.  Patient was placed on the schedule today for I&D possible poly exchange and ID consult.  PAST MEDICAL HISTORY:   Past Medical History  Diagnosis Date  . Anxiety     takin Fluoxetine  . GERD (gastroesophageal reflux disease)     no longer taking meds, elevates HOB  . Arthritis   . Dry skin     "winter itch", using Kenalog cream    PAST SURGICAL HISTORY:   Past Surgical History  Procedure Laterality Date  . Medial partial knee replacement Right 04/06/2013  . Vasectomy    . Colonoscopy    . Back surgery  1991    lumbar surgery  . Hamstring surgery  1958    to stretch hamstring    MEDICATIONS:   Medications Prior to Admission  Medication Sig Dispense Refill  . ciprofloxacin (CIPRO) 750 MG tablet Take 750 mg by mouth 2 (two) times daily. Starting 04/21/13 for 10 day course      . FLUoxetine (PROZAC) 40 MG capsule Take 40 mg by mouth daily.      Marland Kitchen ibuprofen (ADVIL,MOTRIN) 800 MG tablet Take 800 mg by mouth 3 (three) times daily.      Marland Kitchen oxyCODONE (OXY IR/ROXICODONE) 5 MG immediate release tablet Take 5 mg by mouth every 4 (four) hours as needed for severe pain.      Marland Kitchen triamcinolone cream (KENALOG) 0.1 % Apply 1 application topically 2 (two) times daily as needed (itching/rash).        ALLERGIES:  No Known Allergies  REVIEW OF SYSTEMS:   Negative except hpi  FAMILY HISTORY:   Family History  Problem Relation Age of Onset   . Alzheimer's disease Mother   . Hypertension Mother   . Alzheimer's disease Father     SOCIAL HISTORY:   reports that he has never smoked. He has never used smokeless tobacco. He reports that he drinks alcohol. He reports that he does not use illicit drugs.  PHYSICAL EXAM:  General appearance: alert, cooperative and no distress Resp: clear to auscultation bilaterally Cardio: regular rate and rhythm, S1, S2 normal, no murmur, click, rub or gallop GI: soft, non-tender; bowel sounds normal; no masses,  no organomegaly Extremities: extremities normal, atraumatic, no cyanosis or edema and Homans sign is negative, no sign of DVT Pulses: 2+ and symmetric Skin: Skin color, texture, turgor normal. No rashes or lesions Neurologic: Alert and oriented X 3, normal strength and tone. Normal symmetric reflexes. Normal coordination and gait   Right knee is red, swollen with a moderate amount of purlent drainage   LABORATORY STUDIES:  Recent Labs  04/22/13 0944  WBC 10.0  HGB 14.9  HCT 40.7  PLT 392     Recent Labs  04/22/13 0944  NA 133*  K 3.8  CL 97  CO2 16*  GLUCOSE 150*  BUN 12  CREATININE 0.84  CALCIUM 9.4  STUDIES/RESULTS:  No results found.  ASSESSMENT:  Right infected knee        Active Problems:   * No active hospital problems. *    PLAN: right knee I&D possible poly exchange and ID consult   Naftoli Penny 04/23/2013. 7:02 AM

## 2013-04-24 LAB — CBC
HEMATOCRIT: 34.1 % — AB (ref 39.0–52.0)
Hemoglobin: 11.9 g/dL — ABNORMAL LOW (ref 13.0–17.0)
MCH: 32 pg (ref 26.0–34.0)
MCHC: 34.9 g/dL (ref 30.0–36.0)
MCV: 91.7 fL (ref 78.0–100.0)
Platelets: 328 10*3/uL (ref 150–400)
RBC: 3.72 MIL/uL — AB (ref 4.22–5.81)
RDW: 13.1 % (ref 11.5–15.5)
WBC: 7.7 10*3/uL (ref 4.0–10.5)

## 2013-04-24 LAB — BASIC METABOLIC PANEL
BUN: 7 mg/dL (ref 6–23)
CO2: 26 meq/L (ref 19–32)
CREATININE: 0.7 mg/dL (ref 0.50–1.35)
Calcium: 8.3 mg/dL — ABNORMAL LOW (ref 8.4–10.5)
Chloride: 98 mEq/L (ref 96–112)
GFR calc Af Amer: >90 mL/min (ref >90)
GFR calc non Af Amer: >90 mL/min (ref >90)
Glucose, Bld: 106 mg/dL — ABNORMAL HIGH (ref 70–99)
Potassium: 3.7 mEq/L (ref 3.7–5.3)
Sodium: 135 mEq/L — ABNORMAL LOW (ref 137–147)

## 2013-04-24 LAB — SEDIMENTATION RATE: SED RATE: 64 mm/h — AB (ref 0–16)

## 2013-04-24 LAB — HEPATITIS C ANTIBODY (REFLEX): HCV Ab: NEGATIVE

## 2013-04-24 LAB — C-REACTIVE PROTEIN: CRP: 16 mg/dL — AB (ref <0.60)

## 2013-04-24 NOTE — Progress Notes (Signed)
[facility] for Infectious Disease  Day #2 vancomycin  day #2 rifampin  Subjective: No new complaints   Antibiotics:  Anti-infectives   Start     Dose/Rate Route Frequency Ordered Stop   04/23/13 2200  vancomycin (VANCOCIN) IVPB 1000 mg/200 mL premix     1,000 mg 200 mL/hr over 60 Minutes Intravenous Every 12 hours 04/23/13 1236     04/23/13 1400  ceFAZolin (ANCEF) IVPB 2 g/50 mL premix     2 g 100 mL/hr over 30 Minutes Intravenous Every 6 hours 04/23/13 1053 04/23/13 2200   04/23/13 1330  rifampin (RIFADIN) capsule 300 mg     300 mg Oral Every 12 hours 04/23/13 1229     04/23/13 1315  vancomycin (VANCOCIN) IVPB 1000 mg/200 mL premix     1,000 mg 200 mL/hr over 60 Minutes Intravenous  Once 04/23/13 1236 04/23/13 1809   04/23/13 0631  ceFAZolin (ANCEF) 3 g in dextrose 5 % 50 mL IVPB     3 g 160 mL/hr over 30 Minutes Intravenous On call to O.R. 04/23/13 0631 04/23/13 0803      Medications: Scheduled Meds: . aspirin EC  325 mg Oral Q breakfast  . docusate sodium  100 mg Oral BID  . FLUoxetine  40 mg Oral Daily  . OxyCODONE  10 mg Oral Q12H  . rifampin  300 mg Oral Q12H  . vancomycin  1,000 mg Intravenous Q12H   Continuous Infusions: . sodium chloride 10 mL/hr at 04/24/13 0559   PRN Meds:.acetaminophen, acetaminophen, alum & mag hydroxide-simeth, bisacodyl, diphenhydrAMINE, HYDROmorphone (DILAUDID) injection, menthol-cetylpyridinium, methocarbamol (ROBAXIN) IV, methocarbamol, metoCLOPramide (REGLAN) injection, metoCLOPramide, ondansetron (ZOFRAN) IV, ondansetron, oxyCODONE, phenol, senna-docusate, sodium chloride, triamcinolone cream    Objective: Weight change:   Intake/Output Summary (Last 24 hours) at 04/24/13 1311 Last data filed at 04/24/13 0719  Gross per 24 hour  Intake 2389.83 ml  Output   1435 ml  Net 954.83 ml   Blood pressure 157/87, pulse 90, temperature 98.2 F (36.8 C), temperature source Oral, resp. rate 16, weight 158 lb (71.668 kg), SpO2  94.00%. Temp:  [98.2 F (36.8 C)-98.6 F (37 C)] 98.2 F (36.8 C) (04/05 0501) Pulse Rate:  [90-108] 90 (04/05 0501) Resp:  [16-20] 16 (04/05 0501) BP: (157-165)/(87-89) 157/87 mmHg (04/05 0501) SpO2:  [94 %-98 %] 94 % (04/05 0501)  Physical Exam: General: Alert and awake, oriented x3, not in any acute distress.  HEENT: anicteric sclera, , EOMI, oropharynx clear and without exudate  CVS regular rate, normal r, no murmur rubs or gallops  Chest: clear to auscultation bilaterally, no wheezing, rales or rhonchi  Abdomen: soft nontender, nondistended, normal bowel sounds,  Extremities: knee bandaged  Skin: no rashes  Neuro: nonfocal, strength and sensation intact   CBC:   Recent Labs  04/22/13 0944 04/24/13 0510  WBC 10.0 7.7  HGB 14.9 11.9*  HCT 40.7 34.1*  NA 133* 135*  K 3.8 3.7  CL 97 98  CO2 16* 26  BUN 12 7  CREATININE 0.84 0.70     BMET  Recent Labs  04/22/13 0944 04/24/13 0510  NA 133* 135*  K 3.8 3.7  CL 97 98  CO2 16* 26  GLUCOSE 150* 106*  BUN 12 7  CREATININE 0.84 0.70  CALCIUM 9.4 8.3*     Liver Panel  No results found for this basename: PROT, ALBUMIN, AST, ALT, ALKPHOS, BILITOT, BILIDIR, IBILI,  in the last 72 hours     Sedimentation Rate  Recent Labs  04/24/13 0510  ESRSEDRATE 64*   C-Reactive Protein No results found for this basename: CRP,  in the last 72 hours  Micro Results: Recent Results (from the past 240 hour(s))  WOUND CULTURE     Status: None   Collection Time    04/23/13  8:03 AM      Result Value Ref Range Status   Specimen Description WOUND RIGHT KNEE   Final   Special Requests NONE   Final   Gram Stain     Final   Value: ABUNDANT WBC PRESENT,BOTH PMN AND MONONUCLEAR     NO SQUAMOUS EPITHELIAL CELLS SEEN     NO ORGANISMS SEEN     Performed at [facility]   Culture     Final   Value: NO GROWTH 1 DAY     Performed at [facility]   Report Status PENDING   Incomplete  ANAEROBIC CULTURE      Status: None   Collection Time    04/23/13  8:03 AM      Result Value Ref Range Status   Specimen Description WOUND RIGHT KNEE   Final   Special Requests NONE   Final   Gram Stain     Final   Value: MODERATE WBC PRESENT, PREDOMINANTLY PMN     NO SQUAMOUS EPITHELIAL CELLS SEEN     NO ORGANISMS SEEN     Performed at [facility]   Culture     Final   Value: NO ANAEROBES ISOLATED; CULTURE IN PROGRESS FOR 5 DAYS     Performed at [facility]   Report Status PENDING   Incomplete  BODY FLUID CULTURE     Status: None   Collection Time    04/23/13  8:03 AM      Result Value Ref Range Status   Specimen Description SYNOVIAL RIGHT FLUID KNEE   Final   Special Requests NONE   Final   Gram Stain     Final   Value: ABUNDANT WBC PRESENT, PREDOMINANTLY PMN     NO ORGANISMS SEEN     Gram Stain Report Called to,Read Back By and Verified With: Gram Stain Report Called to,Read Back By and Verified With: MISTY CORNELL 04/23/13 @ 4:58PM BY RUSCOE A.     Performed at [facility]     Final   Value: NO GROWTH 1 DAY     Performed at [facility]   Report Status PENDING   Incomplete  ANAEROBIC CULTURE     Status: None   Collection Time    04/23/13  8:03 AM      Result Value Ref Range Status   Specimen Description SYNOVIAL RIGHT FLUID KNEE   Final   Special Requests NONE   Final   Gram Stain     Final   Value: MODERATE WBC PRESENT, PREDOMINANTLY PMN     NO SQUAMOUS EPITHELIAL CELLS SEEN     NO ORGANISMS SEEN     Performed at [facility]   Culture     Final   Value: NO ANAEROBES ISOLATED; CULTURE IN PROGRESS FOR 5 DAYS     Performed at [facility]   Report Status PENDING   Incomplete  GRAM STAIN     Status: None   Collection Time    04/23/13  8:03 AM      Result Value Ref Range Status   Specimen Description SYNOVIAL RIGHT FLUID KNEE  Final   Special Requests NONE   Final   Gram Stain     Final   Value: FEW WBC PRESENT, PREDOMINANTLY  PMN     NO ORGANISMS SEEN   Report Status 04/23/2013 FINAL   Final  GRAM STAIN     Status: None   Collection Time    04/23/13  8:03 AM      Result Value Ref Range Status   Specimen Description WOUND RIGHT KNEE   Final   Special Requests NONE   Final   Gram Stain     Final   Value: ABUNDANT WBC PRESENT,BOTH PMN AND MONONUCLEAR     NO ORGANISMS SEEN   Report Status 04/23/2013 FINAL   Final    Studies/Results: No results found.    Assessment/Plan:  Active Problems:   Knee pain, acute    IVON OELKERS is a 63 y.o. 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   #1 Prosthetic joint infection : Cultures at [facility] or growing Staphylococcus aureus sensitivities are pending  --Continue vancomycin for goal trough of 15-20  --start rifampin 300mg  bid  --If this is methicillin sensitive staph aureus we'll change to Ancef 2 g IV every 8 hours if it is methicillin resistant staph aureus we'll continue vancomycin dose for trough goal of 15-20  Will need 6 weeks of IV abx,followed by po abx for 6 months minimum if not longer will be harder to treat given it is SA  #2 Screening: check HIV and Hep C   #3 Etoh use: 3 beers per day, will need to abstain as much as possible with rifampin on board  #4 IP"": Contact precautions pending identification of organism as MRSA versus MSSA  LOS: 1 day   Paden 04/24/2013, 1:11 PM

## 2013-04-24 NOTE — Progress Notes (Signed)
Physical Therapy Treatment Patient Details Name: Ricky Davis MRN: [id number] DOB: [birth year] Today's Date: 04/24/2013    History of Present Illness Pt s/p TKA ~2 weeks ago, and developed infection in the surgical knee. Pt is now s/p I&D with poly exchange, WBAT.     PT Comments    Pt trialed ambulation with cane today, however mildly unsteady and encouraged RW use until can have more practice with cane with therapies.  Will continue to follow and will need stairs practice prior to D/C.    Follow Up Recommendations  Home health PT     Equipment Recommendations  None recommended by PT    Recommendations for Other Services       Precautions / Restrictions Precautions Precautions: Fall;Knee Restrictions Weight Bearing Restrictions: Yes RLE Weight Bearing: Weight bearing as tolerated    Mobility  Bed Mobility Overal bed mobility: Modified Independent                Transfers Overall transfer level: Needs assistance Equipment used: Straight cane Transfers: Sit to/from Stand Sit to Stand: Supervision         General transfer comment: pt demos good use of UEs.    Ambulation/Gait Ambulation/Gait assistance: Min guard Ambulation Distance (Feet): 250 Feet Assistive device: Straight cane Gait Pattern/deviations: Step-through pattern;Decreased stride length;Decreased step length - left;Decreased stance time - right   Gait velocity interpretation: Below normal speed for age/gender General Gait Details: cues for safety with cane.  pt indicates not feeling very steady with cane yet.  Discussed continued use of RW and can trial cane with therapy as pt tolerates.     Stairs            Wheelchair Mobility    Modified Rankin (Stroke Patients Only)       Balance                                    Cognition Arousal/Alertness: Awake/alert Behavior During Therapy: WFL for tasks assessed/performed Overall Cognitive Status: Within Functional  Limits for tasks assessed                      Exercises Total Joint Exercises Ankle Circles/Pumps: AROM;Right;10 reps Long Arc Quad: AROM;Right;10 reps Knee Flexion: AROM;Right;10 reps Goniometric ROM: After pt performs ROM able to AROM ~ 10 - 75    General Comments        Pertinent Vitals/Pain During ROM 5/10.      Home Living                      Prior Function            PT Goals (current goals can now be found in the care plan section) Acute Rehab PT Goals Time For Goal Achievement: 04/30/13 Potential to Achieve Goals: Good Progress towards PT goals: Progressing toward goals    Frequency  7X/week    PT Plan Current plan remains appropriate    Co-evaluation             End of Session Equipment Utilized During Treatment: Gait belt Activity Tolerance: Patient tolerated treatment well Patient left: in chair;with call bell/phone within reach     Time: 1047-1109 PT Time Calculation (min): 22 min  Charges:  $Gait Training: 8-22 mins  G CodesCatarina Hartshorn, [place] 04/24/2013, 12:09 PM

## 2013-04-24 NOTE — Progress Notes (Signed)
Subjective: 1 Day Post-Op Procedure(s) (LRB): IRRIGATION AND DEBRIDEMENT KNEE WITH POLY EXCHANGE (Right) Patient reports pain as 2 on 0-10 scale.  Ricky Davis is doing reasonably well with pain control.  Very eager to go home tomorrow.  No nausea/vomiting, lightheadedness/dizziness.  Positive flatus and bm.  Tolerating diet well.  Wbc count trending down.  Objective: Vital signs in last 24 hours: Temp:  [98 F (36.7 C)-98.6 F (37 C)] 98.2 F (36.8 C) (04/05 0501) Pulse Rate:  [90-108] 90 (04/05 0501) Resp:  [15-20] 16 (04/05 0501) BP: (122-165)/(66-89) 157/87 mmHg (04/05 0501) SpO2:  [94 %-100 %] 94 % (04/05 0501)  Intake/Output from previous day: 04/04 0701 - 04/05 0700 In: 3269.8 [P.O.:1040; I.V.:2229.8] Out: 860 [Urine:800; Drains:60] Intake/Output this shift: Total I/O In: 360 [P.O.:360] Out: 575 [Urine:575]   Recent Labs  04/22/13 0944 04/24/13 0510  HGB 14.9 11.9*    Recent Labs  04/22/13 0944 04/24/13 0510  WBC 10.0 7.7  RBC 4.57 3.72*  HCT 40.7 34.1*  PLT 392 328    Recent Labs  04/22/13 0944 04/24/13 0510  NA 133* 135*  K 3.8 3.7  CL 97 98  CO2 16* 26  BUN 12 7  CREATININE 0.84 0.70  GLUCOSE 150* 106*  CALCIUM 9.4 8.3*   No results found for this basename: LABPT, INR,  in the last 72 hours  Neurologically intact ABD soft Neurovascular intact Sensation intact distally Intact pulses distally Dorsiflexion/Plantar flexion intact Compartment soft hemovac drain putting out fair amount of serosanguinous fluid this am  Assessment/Plan: 1 Day Post-Op Procedure(s) (LRB): IRRIGATION AND DEBRIDEMENT KNEE WITH POLY EXCHANGE (Right) Advance diet Plan for discharge tomorrow Discharge home with home health WBAT Will hold off on pulling Hemovac drain today Continue plan per ID  ANTON, M. LINDSEY 04/24/2013, 9:48 AM

## 2013-04-24 NOTE — Progress Notes (Signed)
Patient's Ricky Davis came out when he was coming back from the bathroom. Scant drainage - serosanguinous. Will continue to monitor Davis site for additional drainage.

## 2013-04-25 DIAGNOSIS — Z96659 Presence of unspecified artificial knee joint: Secondary | ICD-10-CM

## 2013-04-25 DIAGNOSIS — A4901 Methicillin susceptible Staphylococcus aureus infection, unspecified site: Secondary | ICD-10-CM

## 2013-04-25 DIAGNOSIS — Y849 Medical procedure, unspecified as the cause of abnormal reaction of the patient, or of later complication, without mention of misadventure at the time of the procedure: Secondary | ICD-10-CM

## 2013-04-25 DIAGNOSIS — F1010 Alcohol abuse, uncomplicated: Secondary | ICD-10-CM

## 2013-04-25 DIAGNOSIS — T8450XA Infection and inflammatory reaction due to unspecified internal joint prosthesis, initial encounter: Principal | ICD-10-CM

## 2013-04-25 LAB — CBC
HCT: 34.5 % — ABNORMAL LOW (ref 39.0–52.0)
HEMOGLOBIN: 12.1 g/dL — AB (ref 13.0–17.0)
MCH: 32 pg (ref 26.0–34.0)
MCHC: 35.1 g/dL (ref 30.0–36.0)
MCV: 91.3 fL (ref 78.0–100.0)
Platelets: 340 10*3/uL (ref 150–400)
RBC: 3.78 MIL/uL — AB (ref 4.22–5.81)
RDW: 12.9 % (ref 11.5–15.5)
WBC: 7.3 10*3/uL (ref 4.0–10.5)

## 2013-04-25 LAB — WOUND CULTURE: CULTURE: NO GROWTH

## 2013-04-25 MED ORDER — CEFAZOLIN SODIUM-DEXTROSE 2-3 GM-% IV SOLR
2.0000 g | Freq: Three times a day (TID) | INTRAVENOUS | Status: DC
  Administered 2013-04-25: 2 g via INTRAVENOUS
  Filled 2013-04-25 (×2): qty 50

## 2013-04-25 MED ORDER — CEFAZOLIN SODIUM-DEXTROSE 2-3 GM-% IV SOLR: 2.0000 g | Freq: Three times a day (TID) | INTRAVENOUS | Status: DC

## 2013-04-25 MED ORDER — RIFAMPIN 300 MG PO CAPS: 300.0000 mg | ORAL_CAPSULE | Freq: Two times a day (BID) | ORAL | Status: DC

## 2013-04-25 MED ORDER — ASPIRIN 325 MG PO TBEC: 325.0000 mg | DELAYED_RELEASE_TABLET | Freq: Every day | ORAL | Status: DC

## 2013-04-25 MED ORDER — METOCLOPRAMIDE HCL 5 MG PO TABS: 5.0000 mg | ORAL_TABLET | Freq: Three times a day (TID) | ORAL | Status: DC | PRN

## 2013-04-25 MED ORDER — HEPARIN SOD (PORK) LOCK FLUSH 100 UNIT/ML IV SOLN
250.0000 [IU] | INTRAVENOUS | Status: AC | PRN
  Administered 2013-04-25: 250 [IU]

## 2013-04-25 NOTE — Progress Notes (Signed)
Physical Therapy Treatment Patient Details Name: Ricky Davis MRN: [id number] DOB: [birth year] Today's Date: 04/25/2013    History of Present Illness Pt s/p TKA ~2 weeks ago, and developed infection in the surgical knee. Pt is now s/p I&D with poly exchange, WBAT.     PT Comments    Pt is making great progress towards functional mobility and is able to safely D/C home from hospital. Pt ambulated with RW and SPC, he tolerated the [facility] but PT continues to recommend use of RW for the time being for increased stability.    Follow Up Recommendations  Home health PT     Equipment Recommendations  Cane;Rolling walker with 5" wheels    Recommendations for Other Services       Precautions / Restrictions Precautions Precautions: Fall;Knee Restrictions Weight Bearing Restrictions: Yes RLE Weight Bearing: Weight bearing as tolerated    Mobility  Bed Mobility                  Transfers Overall transfer level: Modified independent Equipment used: Rolling walker (2 wheeled);Straight cane Transfers: Sit to/from Stand Sit to Stand: Modified independent (Device/Increase time)         General transfer comment: pt transfered from sit to stand without cueing and with ease  Ambulation/Gait Ambulation/Gait assistance: Min guard;Supervision (Supervision with walker, min guard with cane) Ambulation Distance (Feet): 540 Feet (about 20 ft with cane) Assistive device: Rolling walker (2 wheeled);Straight cane Gait Pattern/deviations: Step-through pattern;Decreased stance time - right;Decreased step length - left   Gait velocity interpretation: at or above normal speed for age/gender (Ambulated well with walker; decreased velocity with cane) General Gait Details: pt tolerated ambulation with cane but this PT decided to switch back to RW since pt looked unsteady    Stairs Stairs: Yes Stairs assistance: Supervision Stair Management: One rail Left;Forwards;With walker;Alternating  pattern Number of Stairs: 3 General stair comments: Began ascending stairs with operative LE  Step-to pattern descending   Wheelchair Mobility    Modified Rankin (Stroke Patients Only)       Balance                                    Cognition Arousal/Alertness: Awake/alert Behavior During Therapy: WFL for tasks assessed/performed Overall Cognitive Status: Within Functional Limits for tasks assessed                      Exercises Total Joint Exercises Goniometric ROM: AROM 16-86 (measured in sitting) Quad sets X10 SLR X5-10 Seated knee flexion X4   General Comments        Pertinent Vitals/Pain 2/10 pain, increased slightly with activity    Home Living                      Prior Function            PT Goals (current goals can now be found in the care plan section) Acute Rehab PT Goals Time For Goal Achievement: 04/30/13 Potential to Achieve Goals: Good Progress towards PT goals: Progressing toward goals (adequate functional mobility for D/C)    Frequency  7X/week    PT Plan Current plan remains appropriate    Co-evaluation             End of Session Equipment Utilized During Treatment: Gait belt Activity Tolerance: Patient tolerated treatment well Patient left: in chair;with call bell/phone within  reach;with family/visitor present     Time: 1022-1054 PT Time Calculation (min): 32 min  Charges:  $Gait Training: 8-22 mins $Therapeutic Exercise: 8-22 mins                    G Codes:      Junita Push, SPT 04/25/2013, 2:56 PM

## 2013-04-25 NOTE — Progress Notes (Signed)
SPORTS MEDICINE AND JOINT REPLACEMENT  Lara Mulch, MD   Carlynn Spry, PA-C [place], [place], [place]  [address]                             [phone]   PROGRESS NOTE  Subjective:  negative for Chest Pain  negative for Shortness of Breath  positive for Nausea/Vomiting   negative for Calf Pain  negative for Bowel Movement   Tolerating Diet: yes         Patient reports pain as 3 on 0-10 scale.    Objective: Vital signs in last 24 hours:   Patient Vitals for the past 24 hrs:  BP Temp Temp src Pulse Resp SpO2  04/25/13 0800 - - - - 18 -  04/25/13 0639 150/90 mmHg - - - - -  04/25/13 0629 162/100 mmHg 99.7 F (37.6 C) Oral 106 18 99 %  04/24/13 2206 155/77 mmHg 98.1 F (36.7 C) Oral 91 14 96 %  04/24/13 1555 - - - - 16 -  04/24/13 1345 143/85 mmHg 98.4 F (36.9 C) - 97 16 98 %  04/24/13 1200 - - - - 16 -    @flow {1959:LAST@   Intake/Output from previous day:   04/05 0701 - 04/06 0700 In: 1680 [P.O.:1480] Out: 2350 [Urine:2275; Drains:75]   Intake/Output this shift:       Intake/Output     04/05 0701 - 04/06 0700 04/06 0701 - 04/07 0700   P.O. 1480    I.V. (mL/kg)     IV Piggyback 200    Total Intake(mL/kg) 1680 (23.4)    Urine (mL/kg/hr) 2275 (1.3)    Drains 75 (0)    Total Output 2350     Net -670          Urine Occurrence 4 x    Stool Occurrence 1 x       LABORATORY DATA:  Recent Labs  04/22/13 0944 04/24/13 0510 04/25/13 0503  WBC 10.0 7.7 7.3  HGB 14.9 11.9* 12.1*  HCT 40.7 34.1* 34.5*  PLT 392 328 340    Recent Labs  04/22/13 0944 04/24/13 0510  NA 133* 135*  K 3.8 3.7  CL 97 98  CO2 16* 26  BUN 12 7  CREATININE 0.84 0.70  GLUCOSE 150* 106*  CALCIUM 9.4 8.3*   No results found for this basename: INR, PROTIME    Examination:  General appearance: alert, cooperative and no distress Extremities: extremities normal, atraumatic, no cyanosis or edema and Homans sign is negative, no sign of DVT  Wound Exam: clean,  dry, intact   Drainage:  None: wound tissue dry  Motor Exam: EHL and FHL Intact  Sensory Exam: Deep Peroneal normal   Assessment:    2 Days Post-Op  Procedure(s) (LRB): IRRIGATION AND DEBRIDEMENT KNEE WITH POLY EXCHANGE (Right)  ADDITIONAL DIAGNOSIS:  Active Problems:   Knee pain, acute  Acute Blood Loss Anemia   Plan: Physical Therapy as ordered Weight Bearing as Tolerated (WBAT)  DVT Prophylaxis:  Aspirin  DISCHARGE PLAN: Home  DISCHARGE NEEDS: HHPT, HHRN, Walker, 3-in-1 comode seat and IV Antibiotics         Marypat Kimmet 04/25/2013, 10:35 AM

## 2013-04-25 NOTE — Discharge Summary (Signed)
SPORTS MEDICINE & JOINT REPLACEMENT   Lara Mulch, MD   Carlynn Spry, PA-C [place], [place],   [address]                             [phone]  PATIENT ID: Ricky Davis        MRN:  [id number]          DOB/AGE: 63-Aug-1952 / 63 y.o.    DISCHARGE SUMMARY  ADMISSION DATE:    04/23/2013 DISCHARGE DATE:   04/25/2013   ADMISSION DIAGNOSIS: infection right partial knee replacement    DISCHARGE DIAGNOSIS:  infection right partial knee replacement    ADDITIONAL DIAGNOSIS: Active Problems:   Knee pain, acute  Past Medical History  Diagnosis Date  . Anxiety     takin Fluoxetine  . GERD (gastroesophageal reflux disease)     no longer taking meds, elevates HOB  . Arthritis   . Dry skin     "winter itch", using Kenalog cream    PROCEDURE: Procedure(s): IRRIGATION AND DEBRIDEMENT KNEE WITH POLY EXCHANGE on 04/23/2013  CONSULTS:     HISTORY:  See H&P in chart  HOSPITAL COURSE:  Ricky Davis is a 62 y.o. admitted on 04/23/2013 and found to have a diagnosis of infection right partial knee replacement.  After appropriate laboratory studies were obtained  they were taken to the operating room on 04/23/2013 and underwent Procedure(s): IRRIGATION AND DEBRIDEMENT KNEE WITH POLY EXCHANGE.   They were given perioperative antibiotics:  Anti-infectives   Start     Dose/Rate Route Frequency Ordered Stop   04/25/13 1045  ceFAZolin (ANCEF) IVPB 2 g/50 mL premix     2 g 100 mL/hr over 30 Minutes Intravenous 3 times per day 04/25/13 0958     04/25/13 0000  ceFAZolin (ANCEF) 2-3 GM-% SOLR     2 g 100 mL/hr over 30 Minutes Intravenous Every 8 hours 04/25/13 1031     04/25/13 0000  rifampin (RIFADIN) 300 MG capsule     300 mg Oral Every 12 hours 04/25/13 1031     04/23/13 2200  vancomycin (VANCOCIN) IVPB 1000 mg/200 mL premix  Status:  Discontinued     1,000 mg 200 mL/hr over 60 Minutes Intravenous Every 12 hours 04/23/13 1236 04/25/13 0958   04/23/13 1400  ceFAZolin (ANCEF)  IVPB 2 g/50 mL premix     2 g 100 mL/hr over 30 Minutes Intravenous Every 6 hours 04/23/13 1053 04/23/13 2200   04/23/13 1330  rifampin (RIFADIN) capsule 300 mg     300 mg Oral Every 12 hours 04/23/13 1229     04/23/13 1315  vancomycin (VANCOCIN) IVPB 1000 mg/200 mL premix     1,000 mg 200 mL/hr over 60 Minutes Intravenous  Once 04/23/13 1236 04/23/13 1809   04/23/13 0631  ceFAZolin (ANCEF) 3 g in dextrose 5 % 50 mL IVPB     3 g 160 mL/hr over 30 Minutes Intravenous On call to O.R. 04/23/13 2426 04/23/13 0803    . Infectious disease consulted  Tolerated the procedure well.   POD# 1: Vital signs were stable.  Patient denied Chest pain, shortness of breath, or calf pain.  Patient was started on Lovenox asa 325 at 8am.  Consults to PT, OT, and care management were made.  The patient was weight bearing as tolerated.  Incentive spirometry was taught.  Dressing was changed.  Marcaine pump and hemovac were discontinued.  POD #2, Continued  PT for ambulation and exercise program.  MSSA grown  The remainder of the hospital course was dedicated to ambulation and strengthening.   The patient was discharged on 2 Days Post-Op in  Good condition.  Blood products given:none  DIAGNOSTIC STUDIES: Recent vital signs: Patient Vitals for the past 24 hrs:  BP Temp Temp src Pulse Resp SpO2  04/25/13 0800 - - - - 18 -  04/25/13 0639 150/90 mmHg - - - - -  04/25/13 0629 162/100 mmHg 99.7 F (37.6 C) Oral 106 18 99 %  04/24/13 2206 155/77 mmHg 98.1 F (36.7 C) Oral 91 14 96 %  04/24/13 1555 - - - - 16 -  04/24/13 1345 143/85 mmHg 98.4 F (36.9 C) - 97 16 98 %  04/24/13 1200 - - - - 16 -       Recent laboratory studies:  Recent Labs  04/22/13 0944 04/24/13 0510 04/25/13 0503  WBC 10.0 7.7 7.3  HGB 14.9 11.9* 12.1*  HCT 40.7 34.1* 34.5*  PLT 392 328 340    Recent Labs  04/22/13 0944 04/24/13 0510  NA 133* 135*  K 3.8 3.7  CL 97 98  CO2 16* 26  BUN 12 7  CREATININE 0.84 0.70   GLUCOSE 150* 106*  CALCIUM 9.4 8.3*   No results found for this basename: INR, PROTIME     Recent Radiographic Studies :  No results found.  DISCHARGE INSTRUCTIONS: Discharge Orders   Future Orders Complete By Expires   Call MD / Call 911  As directed    Comments:     If you experience chest pain or shortness of breath, CALL 911 and be transported to the hospital emergency room.  If you develope a fever above 101 F, pus (white drainage) or increased drainage or redness at the wound, or calf pain, call your surgeon's office.   Change dressing  As directed    Comments:     Change dressing on Tuesday, then change the dressing daily with sterile 4 x 4 inch gauze dressing and apply TED hose.   Constipation Prevention  As directed    Comments:     Drink plenty of fluids.  Prune juice may be helpful.  You may use a stool softener, such as Colace (over the counter) 100 mg twice a day.  Use MiraLax (over the counter) for constipation as needed.   Diet - low sodium heart healthy  As directed    Driving restrictions  As directed    Comments:     No driving for 6 weeks   Increase activity slowly as tolerated  As directed    Lifting restrictions  As directed    Comments:     No lifting for 6 weeks      DISCHARGE MEDICATIONS:     Medication List    STOP taking these medications       ciprofloxacin 750 MG tablet  Commonly known as:  CIPRO      TAKE these medications       aspirin 325 MG EC tablet  Take 1 tablet (325 mg total) by mouth daily with breakfast.     ceFAZolin 2-3 GM-% Solr  Commonly known as:  ANCEF  Inject 50 mLs (2 g total) into the vein every 8 (eight) hours.     FLUoxetine 40 MG capsule  Commonly known as:  PROZAC  Take 40 mg by mouth daily.     ibuprofen 800 MG tablet  Commonly known as:  ADVIL,MOTRIN  Take 800 mg by mouth 3 (three) times daily.     metoCLOPramide 5 MG tablet  Commonly known as:  REGLAN  Take 1-2 tablets (5-10 mg total) by mouth every 8  (eight) hours as needed for nausea (if ondansetron (ZOFRAN) ineffective.).     oxyCODONE 5 MG immediate release tablet  Commonly known as:  Oxy IR/ROXICODONE  Take 5 mg by mouth every 4 (four) hours as needed for severe pain.     rifampin 300 MG capsule  Commonly known as:  RIFADIN  Take 1 capsule (300 mg total) by mouth every 12 (twelve) hours.     triamcinolone cream 0.1 %  Commonly known as:  KENALOG  Apply 1 application topically 2 (two) times daily as needed (itching/rash).        FOLLOW UP VISIT:       Follow-up Information   Follow up with Rudean Haskell, MD. Call on 04/28/2013.   Specialty:  Orthopedic Surgery   Contact information:   [place] [address] [phone]      folow with ID in a couple weeks home on IV antibiotics  DISPOSITION: HOME   CONDITION:  Good   Ricky Davis 04/25/2013, 10:32 AM

## 2013-04-25 NOTE — Discharge Instructions (Signed)
Diet: As you were doing prior to hospitalization   Activity:  Increase activity slowly as tolerated                  No lifting or driving for 6 weeks  Shower:  May shower without a dressing once there is no drainage from your wound.                              Dressing:  You may change your dressing on Tuesday                    Then change the dressing daily with sterile 4"x4"s gauze dressing                       Weight Bearing:  Weight bearing as tolerated as taught in physical therapy.  Use a                                walker or Crutches as instructed.  To prevent constipation: you may use a stool softener such as -               Colace ( over the counter) 100 mg by mouth twice a day                Drink plenty of fluids ( prune juice may be helpful) and high fiber foods                Miralax ( over the counter) for constipation as needed.    Precautions:  If you experience chest pain or shortness of breath - call 911 immediately               For transfer to the hospital emergency department!!               If you develop a fever greater that 101 F, purulent drainage from wound,                             increased redness or drainage from wound, or calf pain -- Call the office.  Follow- Up Appointment:  Please call for an appointment to be seen on 04/28/13                                              [facility] office:  [phone]            [address] [place], [place] [address]

## 2013-04-25 NOTE — Progress Notes (Signed)
Picc line is capped and ready for D/C by IV team. [facility] to come to Pts home to admin home IV abx. D/C instructions and scripts given to Pt. Pt going home with family at this time.

## 2013-04-25 NOTE — Care Management Note (Signed)
CARE MANAGEMENT NOTE 04/25/2013  Patient:  Ricky Davis, Ricky Davis   Account Number:  [id number]  Date Initiated:  04/25/2013  Documentation initiated by:  Ricki Miller  Subjective/Objective Assessment:   63 yr old male admitted with right partial knee replacement infection. S/p I & D.     Action/Plan:   Case managewr spoke with patient and wife concerning home health RN needs for IV antibiotics. Choice offered. Referral called to [facility], [place]. Patient will go home on Ancef and Rifampin. Has PICC line.   Anticipated DC Date:  04/25/2013   Anticipated DC Plan:  [place]  CM consult      [facility] Choice  HOME HEALTH   Choice offered to / List presented to:  C-1 Patient        [place] arranged  HH-1 RN  IV Antibiotics      [place].   Status of service:  Completed, signed off Medicare Important Message given?   (If response is "NO", the following Medicare IM given date fields will be blank) Date Medicare IM given:   Date Additional Medicare IM given:    Discharge Disposition:  [place]

## 2013-04-25 NOTE — Progress Notes (Signed)
[facility] for Infectious Disease  Day # 3  vancomycin  day # 3 rifampin  Subjective: C/o loosing taste in mouth    Antibiotics:  Anti-infectives   Start     Dose/Rate Route Frequency Ordered Stop   04/25/13 1045  ceFAZolin (ANCEF) IVPB 2 g/50 mL premix     2 g 100 mL/hr over 30 Minutes Intravenous 3 times per day 04/25/13 0958     04/25/13 0000  ceFAZolin (ANCEF) 2-3 GM-% SOLR     2 g 100 mL/hr over 30 Minutes Intravenous Every 8 hours 04/25/13 1031     04/25/13 0000  rifampin (RIFADIN) 300 MG capsule     300 mg Oral Every 12 hours 04/25/13 1031     04/23/13 2200  vancomycin (VANCOCIN) IVPB 1000 mg/200 mL premix  Status:  Discontinued     1,000 mg 200 mL/hr over 60 Minutes Intravenous Every 12 hours 04/23/13 1236 04/25/13 0958   04/23/13 1400  ceFAZolin (ANCEF) IVPB 2 g/50 mL premix     2 g 100 mL/hr over 30 Minutes Intravenous Every 6 hours 04/23/13 1053 04/23/13 2200   04/23/13 1330  rifampin (RIFADIN) capsule 300 mg     300 mg Oral Every 12 hours 04/23/13 1229     04/23/13 1315  vancomycin (VANCOCIN) IVPB 1000 mg/200 mL premix     1,000 mg 200 mL/hr over 60 Minutes Intravenous  Once 04/23/13 1236 04/23/13 1809   04/23/13 0631  ceFAZolin (ANCEF) 3 g in dextrose 5 % 50 mL IVPB     3 g 160 mL/hr over 30 Minutes Intravenous On call to O.R. 04/23/13 0631 04/23/13 0803      Medications: Scheduled Meds: . aspirin EC  325 mg Oral Q breakfast  .  ceFAZolin (ANCEF) IV  2 g Intravenous 3 times per day  . docusate sodium  100 mg Oral BID  . FLUoxetine  40 mg Oral Daily  . OxyCODONE  10 mg Oral Q12H  . rifampin  300 mg Oral Q12H   Continuous Infusions: . sodium chloride 10 mL/hr at 04/24/13 0559   PRN Meds:.acetaminophen, acetaminophen, alum & mag hydroxide-simeth, bisacodyl, diphenhydrAMINE, HYDROmorphone (DILAUDID) injection, menthol-cetylpyridinium, methocarbamol (ROBAXIN) IV, methocarbamol, metoCLOPramide (REGLAN) injection, metoCLOPramide, ondansetron (ZOFRAN)  IV, ondansetron, oxyCODONE, phenol, senna-docusate, sodium chloride, triamcinolone cream    Objective: Weight change:   Intake/Output Summary (Last 24 hours) at 04/25/13 1946 Last data filed at 04/25/13 1300  Gross per 24 hour  Intake   1090 ml  Output   1400 ml  Net   -310 ml   Blood pressure 151/89, pulse 106, temperature 98.1 F (36.7 C), temperature source Oral, resp. rate 20, weight 158 lb (71.668 kg), SpO2 100.00%. Temp:  [98.1 F (36.7 C)-99.7 F (37.6 C)] 98.1 F (36.7 C) (04/06 1335) Pulse Rate:  [91-106] 106 (04/06 0629) Resp:  [14-20] 20 (04/06 1335) BP: (150-162)/(77-100) 151/89 mmHg (04/06 1335) SpO2:  [96 %-100 %] 100 % (04/06 1335)  Physical Exam: General: Alert and awake, oriented x3, not in any acute distress.  HEENT: anicteric sclera, , EOMI, oropharynx clear and without exudate  CVS regular rate, normal r, no murmur rubs or gallops  Chest: clear to auscultation bilaterally, no wheezing, rales or rhonchi  Abdomen: soft nontender, nondistended, normal bowel sounds,  Extremities: knee bandaged  Skin: no rashes  Neuro: nonfocal, strength and sensation intact   CBC:   Recent Labs  04/24/13 0510 04/25/13 0503  WBC 7.7 7.3  HGB 11.9* 12.1*  HCT 34.1* 34.5*  NA 135*  --   K 3.7  --   CL 98  --   CO2 26  --   BUN 7  --   CREATININE 0.70  --      BMET  Recent Labs  04/24/13 0510  NA 135*  K 3.7  CL 98  CO2 26  GLUCOSE 106*  BUN 7  CREATININE 0.70  CALCIUM 8.3*     Liver Panel  No results found for this basename: PROT, ALBUMIN, AST, ALT, ALKPHOS, BILITOT, BILIDIR, IBILI,  in the last 72 hours     Sedimentation Rate  Recent Labs  04/24/13 0510  ESRSEDRATE 64*   C-Reactive Protein  Recent Labs  04/24/13 0510  CRP 16.0*    Micro Results: Recent Results (from the past 240 hour(s))  WOUND CULTURE     Status: None   Collection Time    04/23/13  8:03 AM      Result Value Ref Range Status   Specimen Description WOUND  RIGHT KNEE   Final   Special Requests NONE   Final   Gram Stain     Final   Value: ABUNDANT WBC PRESENT,BOTH PMN AND MONONUCLEAR     NO SQUAMOUS EPITHELIAL CELLS SEEN     NO ORGANISMS SEEN     Performed at [facility]   Culture     Final   Value: NO GROWTH 2 DAYS     Performed at [facility]   Report Status 04/25/2013 FINAL   Final  ANAEROBIC CULTURE     Status: None   Collection Time    04/23/13  8:03 AM      Result Value Ref Range Status   Specimen Description WOUND RIGHT KNEE   Final   Special Requests NONE   Final   Gram Stain     Final   Value: MODERATE WBC PRESENT, PREDOMINANTLY PMN     NO SQUAMOUS EPITHELIAL CELLS SEEN     NO ORGANISMS SEEN     Performed at [facility]   Culture     Final   Value: NO ANAEROBES ISOLATED; CULTURE IN PROGRESS FOR 5 DAYS     Performed at [facility]   Report Status PENDING   Incomplete  BODY FLUID CULTURE     Status: None   Collection Time    04/23/13  8:03 AM      Result Value Ref Range Status   Specimen Description SYNOVIAL RIGHT FLUID KNEE   Final   Special Requests NONE   Final   Gram Stain     Final   Value: ABUNDANT WBC PRESENT, PREDOMINANTLY PMN     NO ORGANISMS SEEN     Gram Stain Report Called to,Read Back By and Verified With: Gram Stain Report Called to,Read Back By and Verified With: MISTY CORNELL 04/23/13 @ 4:58PM BY RUSCOE A.     Performed at [facility]     Final   Value: NO GROWTH 2 DAYS     Performed at [facility]   Report Status PENDING   Incomplete  ANAEROBIC CULTURE     Status: None   Collection Time    04/23/13  8:03 AM      Result Value Ref Range Status   Specimen Description SYNOVIAL RIGHT FLUID KNEE   Final   Special Requests NONE   Final   Gram Stain     Final  Value: MODERATE WBC PRESENT, PREDOMINANTLY PMN     NO SQUAMOUS EPITHELIAL CELLS SEEN     NO ORGANISMS SEEN     Performed at [facility]   Culture     Final   Value: NO  ANAEROBES ISOLATED; CULTURE IN PROGRESS FOR 5 DAYS     Performed at [facility]   Report Status PENDING   Incomplete  GRAM STAIN     Status: None   Collection Time    04/23/13  8:03 AM      Result Value Ref Range Status   Specimen Description SYNOVIAL RIGHT FLUID KNEE   Final   Special Requests NONE   Final   Gram Stain     Final   Value: FEW WBC PRESENT, PREDOMINANTLY PMN     NO ORGANISMS SEEN   Report Status 04/23/2013 FINAL   Final  GRAM STAIN     Status: None   Collection Time    04/23/13  8:03 AM      Result Value Ref Range Status   Specimen Description WOUND RIGHT KNEE   Final   Special Requests NONE   Final   Gram Stain     Final   Value: ABUNDANT WBC PRESENT,BOTH PMN AND MONONUCLEAR     NO ORGANISMS SEEN   Report Status 04/23/2013 FINAL   Final    Studies/Results: No results found.    Assessment/Plan:  Active Problems:   Knee pain, acute    Ricky Davis is a 63 y.o. 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 Tommy Medal @ [phone]  Will need 6 weeks of IV abx,followed by po abx for 6 months minimum if not longer will be harder to treat given it is SA  #2 Screening: check HIV and Hep C is negative   I will arrange HSFU in our clinic in the next 3 weeks  #3 Etoh use: 3 beers per day, will need to abstain as much as possible with rifampin on board  #4 IP"": Contact precautions pending identification of organism as MRSA versus MSSA  LOS: 2 days   Alcide Evener 04/25/2013, 7:46 PM

## 2013-04-25 NOTE — Progress Notes (Signed)
Reviewed and agree  Onalee Steinbach, PT  Acute Rehabilitation Services Pager [phone] Office [phone]  

## 2013-04-26 LAB — BODY FLUID CULTURE: Culture: NO GROWTH

## 2013-04-27 ENCOUNTER — Encounter (HOSPITAL_COMMUNITY): Payer: Self-pay | Admitting: Orthopedic Surgery

## 2013-04-27 LAB — HIV-1 RNA QUANT-NO REFLEX-BLD
HIV 1 RNA Quant: <20 copies/mL (ref <20)
HIV-1 RNA Quant, Log: <1.3 {Log} (ref <1.30)

## 2013-04-28 LAB — ANAEROBIC CULTURE

## 2013-05-18 ENCOUNTER — Ambulatory Visit (INDEPENDENT_AMBULATORY_CARE_PROVIDER_SITE_OTHER): Payer: Commercial Managed Care - HMO | Admitting: Infectious Diseases

## 2013-05-18 ENCOUNTER — Telehealth: Payer: Self-pay | Admitting: *Deleted

## 2013-05-18 ENCOUNTER — Encounter: Payer: Self-pay | Admitting: Infectious Diseases

## 2013-05-18 VITALS — BP 152/97 | HR 92 | Temp 97.5°F | Ht 72.0 in | Wt 151.5 lb

## 2013-05-18 DIAGNOSIS — T8453XA Infection and inflammatory reaction due to internal right knee prosthesis, initial encounter: Secondary | ICD-10-CM

## 2013-05-18 DIAGNOSIS — Z96659 Presence of unspecified artificial knee joint: Secondary | ICD-10-CM

## 2013-05-18 DIAGNOSIS — T8450XA Infection and inflammatory reaction due to unspecified internal joint prosthesis, initial encounter: Secondary | ICD-10-CM

## 2013-05-18 MED ORDER — CEPHALEXIN 500 MG PO CAPS: 500.0000 mg | ORAL_CAPSULE | Freq: Two times a day (BID) | ORAL | Status: DC

## 2013-05-18 NOTE — Progress Notes (Signed)
   Subjective:    Patient ID: Ricky Davis, male    DOB: 11-Jan-1951, 63 y.o.   MRN: [id number]  HPI 63 yo M with hx of R TKR in 04-06-13 at surgical center. He returned 04-23-13 with pain and d/c from his wound. He had an outpt aspirate done which had TNTC WBC, and grew MSSA. He underwent I &D and poly-exchange on 04-23-13. He was d/c home on 4-6 on ancef and rifampin.  Complains of light headedness due to rifampin. Urine has turned red.  His knee function is improving. Swelling has decreased, almost to match his L knee. No erythema or heat. No further wound d/c. No problems with PIC- no redness, drainage, tenderness. Infuses well.  CRP (05-16-13) 2.8 CRP (04-24-13) 16.0 Has not had any ETOH since this started.   Review of Systems  Constitutional: Negative for fever, chills and appetite change.  Gastrointestinal: Negative for diarrhea and constipation.  Genitourinary: Negative for difficulty urinating.  Musculoskeletal: Positive for arthralgias.       Objective:   Physical Exam  Constitutional: He appears well-developed and well-nourished.  HENT:  Mouth/Throat: No oropharyngeal exudate.  Eyes: EOM are normal. Pupils are equal, round, and reactive to light.  Neck: Neck supple.  Cardiovascular: Normal rate, regular rhythm and normal heart sounds.   Pulmonary/Chest: Effort normal and breath sounds normal. No respiratory distress.  Abdominal: Soft. Bowel sounds are normal. He exhibits no distension. There is no tenderness.  Musculoskeletal:       Arms:      Legs: Lymphadenopathy:    He has no cervical adenopathy.          Assessment & Plan:

## 2013-05-18 NOTE — Assessment & Plan Note (Addendum)
He will complete his IV anbx in 2 weeks. Will send keflex rx to [facility] in [place], he will continue this for next 3 months. Will recheck his CRP in 1-2 months. Advised him to not drink more than 1 beer/day. His CRP has improved from his hospital value, although not norma by hospital standard (normal by home health lab standard).

## 2013-05-18 NOTE — Telephone Encounter (Signed)
Verbal order per Dr. Johnnye Sima patient to stop rifampin and cefazolin in 2 weeks and to pull picc line at that time. Patient will than start oral keflex. Myrtis Hopping

## 2013-05-25 ENCOUNTER — Other Ambulatory Visit: Payer: Self-pay | Admitting: *Deleted

## 2013-05-25 DIAGNOSIS — T8453XA Infection and inflammatory reaction due to internal right knee prosthesis, initial encounter: Secondary | ICD-10-CM

## 2013-05-25 MED ORDER — CEPHALEXIN 500 MG PO CAPS: 500.0000 mg | ORAL_CAPSULE | Freq: Two times a day (BID) | ORAL | Status: DC

## 2013-05-30 ENCOUNTER — Encounter: Payer: Self-pay | Admitting: Infectious Disease

## 2013-05-30 ENCOUNTER — Telehealth: Payer: Self-pay | Admitting: *Deleted

## 2013-05-30 NOTE — Telephone Encounter (Signed)
Pt's wife called to report pt's intense headache with rifampin.  Pt is to stop rifampin and IV abx on Wednesday 5/13.  Could he transition today (2 days early) to oral Keflex 500mg  BID?  Please advise.   Landis Gandy, RN

## 2013-05-31 NOTE — Telephone Encounter (Signed)
That is fine to stop and change to keflex today thanks

## 2013-06-01 NOTE — Telephone Encounter (Signed)
Left message for patient

## 2013-06-10 ENCOUNTER — Encounter: Payer: Self-pay | Admitting: Infectious Diseases

## 2013-06-10 ENCOUNTER — Encounter: Payer: Self-pay | Admitting: Infectious Disease

## 2013-07-18 ENCOUNTER — Ambulatory Visit (INDEPENDENT_AMBULATORY_CARE_PROVIDER_SITE_OTHER): Payer: Commercial Managed Care - HMO | Admitting: Infectious Disease

## 2013-07-18 ENCOUNTER — Encounter: Payer: Self-pay | Admitting: Infectious Disease

## 2013-07-18 VITALS — BP 155/100 | HR 83 | Temp 97.9°F | Wt 152.0 lb

## 2013-07-18 DIAGNOSIS — T8453XA Infection and inflammatory reaction due to internal right knee prosthesis, initial encounter: Secondary | ICD-10-CM

## 2013-07-18 DIAGNOSIS — M1710 Unilateral primary osteoarthritis, unspecified knee: Secondary | ICD-10-CM

## 2013-07-18 DIAGNOSIS — T8450XA Infection and inflammatory reaction due to unspecified internal joint prosthesis, initial encounter: Secondary | ICD-10-CM

## 2013-07-18 DIAGNOSIS — M1712 Unilateral primary osteoarthritis, left knee: Secondary | ICD-10-CM

## 2013-07-18 DIAGNOSIS — A4901 Methicillin susceptible Staphylococcus aureus infection, unspecified site: Secondary | ICD-10-CM

## 2013-07-18 DIAGNOSIS — Z96659 Presence of unspecified artificial knee joint: Secondary | ICD-10-CM

## 2013-07-18 DIAGNOSIS — IMO0002 Reserved for concepts with insufficient information to code with codable children: Secondary | ICD-10-CM

## 2013-07-18 MED ORDER — CEPHALEXIN 500 MG PO CAPS: 500.0000 mg | ORAL_CAPSULE | Freq: Two times a day (BID) | ORAL | Status: DC

## 2013-07-18 NOTE — Progress Notes (Signed)
   Subjective:    Patient ID: Ricky Davis, male    DOB: 1950-12-15, 62 y.o.   MRN: [id number]  HPI  63 year old with TKA infection with MSSA sp polyexchange and 6 weeks of IV ancef with rifampin now with keflex. He has minimal pain in his right knee where he has had his TKA but has signfiicant pain in left knee where he has also severe OA and is in need for TKA  No fevers, chills, malaise.   Review of Systems  Constitutional: Negative for fever, chills, diaphoresis, activity change, appetite change and fatigue.  HENT: Negative for congestion and rhinorrhea.   Respiratory: Negative for chest tightness and wheezing.   Gastrointestinal: Negative for nausea, vomiting, diarrhea, constipation, blood in stool and anal bleeding.  Genitourinary: Negative for dysuria, hematuria, flank pain and difficulty urinating.  Musculoskeletal: Positive for arthralgias and gait problem. Negative for back pain, joint swelling and myalgias.  Skin: Negative for color change, pallor, rash and wound.  Neurological: Negative for dizziness, tremors, weakness and light-headedness.  Hematological: Negative for adenopathy. Does not bruise/bleed easily.  Psychiatric/Behavioral: Negative for behavioral problems, confusion, sleep disturbance, dysphoric mood, decreased concentration and agitation.       Objective:   Physical Exam  Constitutional: He is oriented to person, place, and time. He appears well-developed and well-nourished. No distress.  HENT:  Head: Normocephalic and atraumatic.  Mouth/Throat: Oropharynx is clear and moist. No oropharyngeal exudate.  Eyes: Conjunctivae and EOM are normal. No scleral icterus.  Neck: Normal range of motion. Neck supple.  Cardiovascular: Normal rate and regular rhythm.   Pulmonary/Chest: Effort normal. No respiratory distress. He has no wheezes.  Abdominal: He exhibits no distension.  Musculoskeletal: He exhibits no edema and no tenderness.  Neurological: He is alert and  oriented to person, place, and time. He exhibits normal muscle tone. Coordination normal.  Skin: Skin is warm and dry. He is not diaphoretic. No erythema. No pallor.  Psychiatric: He has a normal mood and affect. His behavior is normal. Judgment and thought content normal.    Right knee TKA is clean and without effusion:    Left knee: has pain esp medially      Assessment & Plan:   MSSA septic knee: sp polyexchange: finish total of 6 months postoperative abx Recheck his ESR and CRP at next appt  OA in opposite knee: he states that Dr Maureen Ralphs is reluctant to perform TKA on this site while his infection is still being treated in the opposite leg. I personally believe it would be safe to pursure left TKA provided he is rehabing well and no other reason not to pursue TKA. He should not have any greater burden of MSSA in left native knee skin esp while on keflex and whatever residual infection we might be dealing with is deep in the right knee. I WOULD recommend a 7 day course of hibiclense and IN mupirocin 2% BID

## 2013-07-19 ENCOUNTER — Encounter: Payer: Self-pay | Admitting: Infectious Disease

## 2013-07-21 ENCOUNTER — Other Ambulatory Visit: Payer: Self-pay | Admitting: *Deleted

## 2013-07-21 DIAGNOSIS — T8453XA Infection and inflammatory reaction due to internal right knee prosthesis, initial encounter: Secondary | ICD-10-CM

## 2013-07-21 MED ORDER — CEPHALEXIN 500 MG PO CAPS: 500.0000 mg | ORAL_CAPSULE | Freq: Two times a day (BID) | ORAL | Status: DC

## 2013-08-10 ENCOUNTER — Other Ambulatory Visit: Payer: Self-pay | Admitting: Orthopedic Surgery

## 2013-08-11 ENCOUNTER — Other Ambulatory Visit: Payer: Self-pay | Admitting: Orthopedic Surgery

## 2013-08-11 DIAGNOSIS — M25562 Pain in left knee: Secondary | ICD-10-CM

## 2013-08-20 ENCOUNTER — Other Ambulatory Visit: Payer: Commercial Managed Care - HMO

## 2013-08-20 ENCOUNTER — Ambulatory Visit
Admission: RE | Admit: 2013-08-20 | Discharge: 2013-08-20 | Disposition: A | Discharge status: Home / Self Care | Payer: Commercial Managed Care - HMO | Source: Ambulatory Visit | Attending: Orthopedic Surgery | Admitting: Orthopedic Surgery

## 2013-08-20 DIAGNOSIS — M25562 Pain in left knee: Secondary | ICD-10-CM

## 2013-08-22 ENCOUNTER — Other Ambulatory Visit: Payer: Self-pay | Admitting: Orthopedic Surgery

## 2013-08-22 DIAGNOSIS — M25562 Pain in left knee: Secondary | ICD-10-CM

## 2013-08-27 ENCOUNTER — Other Ambulatory Visit: Payer: Commercial Managed Care - HMO

## 2013-08-30 ENCOUNTER — Ambulatory Visit
Admission: RE | Admit: 2013-08-30 | Discharge: 2013-08-30 | Disposition: A | Discharge status: Home / Self Care | Payer: Commercial Managed Care - HMO | Source: Ambulatory Visit | Attending: Orthopedic Surgery | Admitting: Orthopedic Surgery

## 2013-08-30 DIAGNOSIS — M25562 Pain in left knee: Secondary | ICD-10-CM

## 2013-10-19 ENCOUNTER — Ambulatory Visit: Payer: Commercial Managed Care - HMO | Admitting: Infectious Disease

## 2013-10-20 ENCOUNTER — Telehealth: Payer: Self-pay | Admitting: Licensed Clinical Social Worker

## 2013-10-20 NOTE — Telephone Encounter (Signed)
Patient's wife called for the patient wanting to know if he could come off his antibiotics for now. He just had a knee replacement on Friday and saw Dr. Maureen Ralphs a couple of days ago and states that he is doing well. Patient also wants to know if he should keep his appointment on 11/07/13. Please advise

## 2013-10-25 NOTE — Telephone Encounter (Signed)
Received message from Dr. Tommy Medal: "I would have him continue his abx until he sees me" Relayed message to patient's wife, confirmed upcoming appointment. Landis Gandy, RN

## 2013-11-07 ENCOUNTER — Ambulatory Visit (INDEPENDENT_AMBULATORY_CARE_PROVIDER_SITE_OTHER): Payer: Commercial Managed Care - HMO | Admitting: Infectious Disease

## 2013-11-07 ENCOUNTER — Encounter: Payer: Self-pay | Admitting: Infectious Disease

## 2013-11-07 VITALS — BP 165/103 | HR 80 | Temp 97.7°F | Wt 154.0 lb

## 2013-11-07 DIAGNOSIS — Z23 Encounter for immunization: Secondary | ICD-10-CM

## 2013-11-07 DIAGNOSIS — A4901 Methicillin susceptible Staphylococcus aureus infection, unspecified site: Secondary | ICD-10-CM | POA: Insufficient documentation

## 2013-11-07 DIAGNOSIS — Z96652 Presence of left artificial knee joint: Secondary | ICD-10-CM

## 2013-11-07 DIAGNOSIS — T8453XA Infection and inflammatory reaction due to internal right knee prosthesis, initial encounter: Secondary | ICD-10-CM

## 2013-11-07 LAB — C-REACTIVE PROTEIN: CRP: <0.5 mg/dL (ref <0.60)

## 2013-11-07 MED ORDER — CEPHALEXIN 500 MG PO CAPS: 500.0000 mg | ORAL_CAPSULE | Freq: Two times a day (BID) | ORAL | Status: DC

## 2013-11-07 NOTE — Progress Notes (Signed)
   Subjective:    Patient ID: Ricky Davis, male    DOB: [birth year], 63 y.o.   MRN: [id number]  HPI   63 year old with TKA infection with MSSA sp polyexchange and 6 weeks of IV ancef with rifampin now with keflex. 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 Surgeries done in September. His remained on cephalexin. His right knee is doing well without any pain at all he states. His left knee still has some pain and swelling more pain with weightbearing more than anything of No fevers, chills, malaise.   Review of Systems  Constitutional: Negative for fever, chills, diaphoresis, activity change, appetite change and fatigue.  HENT: Negative for congestion and rhinorrhea.   Respiratory: Negative for chest tightness and wheezing.   Gastrointestinal: Negative for nausea, vomiting, diarrhea, constipation, blood in stool and anal bleeding.  Genitourinary: Negative for dysuria, hematuria, flank pain and difficulty urinating.  Musculoskeletal: Positive for arthralgias and gait problem. Negative for back pain, joint swelling and myalgias.  Skin: Negative for color change, pallor, rash and wound.  Neurological: Negative for dizziness, tremors, weakness and light-headedness.  Hematological: Negative for adenopathy. Does not bruise/bleed easily.  Psychiatric/Behavioral: Negative for behavioral problems, confusion, sleep disturbance, dysphoric mood, decreased concentration and agitation.       Objective:   Physical Exam  Constitutional: He is oriented to person, place, and time. He appears well-developed and well-nourished. No distress.  HENT:  Head: Normocephalic and atraumatic.  Mouth/Throat: Oropharynx is clear and moist. No oropharyngeal exudate.  Eyes: Conjunctivae and EOM are normal. No scleral icterus.  Neck: Normal range of motion. Neck supple.  Cardiovascular: Normal rate  and regular rhythm.   Pulmonary/Chest: Effort normal. No respiratory distress. He has no wheezes.  Abdominal: He exhibits no distension.  Musculoskeletal: He exhibits no edema and no tenderness.  Neurological: He is alert and oriented to person, place, and time. He exhibits normal muscle tone. Coordination normal.  Skin: Skin is warm and dry. He is not diaphoretic. No erythema. No pallor.  Psychiatric: He has a normal mood and affect. His behavior is normal. Judgment and thought content normal.    Right knee TKA is clean and without effusion at last visit    Right and left knees today's picture 11/07/2013. Right knee surgical scar is healed up and there is no effusion no tenderness left knee still with effusion some mild tenderness with recent surgery:          Assessment & Plan:   MSSA septic knee: sp polyexchange: sp  6 months postoperative abx. Will recheck a sedimentation rate and C-reactive protein today if there is very encouraging I'll give him ago had to stop his oral antibiotics and he can followup as needed  TKA on the left: Seems to be doing well and without any evidence of infection. I gave patient clear instructions on evidence of infection would be worsening pain and specifically pain at rest.

## 2013-11-08 LAB — SEDIMENTATION RATE: Sed Rate: 1 mm/hr (ref 0–16)

## 2013-11-09 ENCOUNTER — Telehealth: Payer: Self-pay | Admitting: *Deleted

## 2013-11-09 NOTE — Telephone Encounter (Signed)
Shared normal results with pt and wife.

## 2013-11-09 NOTE — Telephone Encounter (Signed)
He can stop his abx

## 2013-11-10 NOTE — Telephone Encounter (Signed)
Per Dr. Tommy Medal, wife will stop abx.  Wife verbalized understanding.

## 2014-02-13 DIAGNOSIS — L298 Other pruritus: Secondary | ICD-10-CM | POA: Diagnosis not present

## 2014-02-13 DIAGNOSIS — K219 Gastro-esophageal reflux disease without esophagitis: Secondary | ICD-10-CM | POA: Diagnosis not present

## 2014-02-13 DIAGNOSIS — L820 Inflamed seborrheic keratosis: Secondary | ICD-10-CM | POA: Diagnosis not present

## 2014-02-13 DIAGNOSIS — A09 Infectious gastroenteritis and colitis, unspecified: Secondary | ICD-10-CM | POA: Diagnosis not present

## 2014-02-27 DIAGNOSIS — Z471 Aftercare following joint replacement surgery: Secondary | ICD-10-CM | POA: Diagnosis not present

## 2014-02-27 DIAGNOSIS — Z96651 Presence of right artificial knee joint: Secondary | ICD-10-CM | POA: Diagnosis not present

## 2014-03-22 DIAGNOSIS — L309 Dermatitis, unspecified: Secondary | ICD-10-CM | POA: Diagnosis not present

## 2014-03-31 DIAGNOSIS — L986 Other infiltrative disorders of the skin and subcutaneous tissue: Secondary | ICD-10-CM | POA: Diagnosis not present

## 2014-03-31 DIAGNOSIS — L300 Nummular dermatitis: Secondary | ICD-10-CM | POA: Diagnosis not present

## 2014-03-31 DIAGNOSIS — D225 Melanocytic nevi of trunk: Secondary | ICD-10-CM | POA: Diagnosis not present

## 2014-03-31 DIAGNOSIS — L821 Other seborrheic keratosis: Secondary | ICD-10-CM | POA: Diagnosis not present

## 2014-03-31 DIAGNOSIS — D485 Neoplasm of uncertain behavior of skin: Secondary | ICD-10-CM | POA: Diagnosis not present

## 2014-05-25 DIAGNOSIS — M5431 Sciatica, right side: Secondary | ICD-10-CM | POA: Diagnosis not present

## 2014-05-25 DIAGNOSIS — Z0000 Encounter for general adult medical examination without abnormal findings: Secondary | ICD-10-CM | POA: Diagnosis not present

## 2014-07-03 DIAGNOSIS — M5431 Sciatica, right side: Secondary | ICD-10-CM | POA: Diagnosis not present

## 2014-07-03 DIAGNOSIS — L821 Other seborrheic keratosis: Secondary | ICD-10-CM | POA: Diagnosis not present

## 2014-07-17 DIAGNOSIS — K621 Rectal polyp: Secondary | ICD-10-CM | POA: Diagnosis not present

## 2014-07-31 DIAGNOSIS — L820 Inflamed seborrheic keratosis: Secondary | ICD-10-CM | POA: Diagnosis not present

## 2014-07-31 DIAGNOSIS — L821 Other seborrheic keratosis: Secondary | ICD-10-CM | POA: Diagnosis not present

## 2014-07-31 DIAGNOSIS — D485 Neoplasm of uncertain behavior of skin: Secondary | ICD-10-CM | POA: Diagnosis not present

## 2014-08-03 DIAGNOSIS — Z1389 Encounter for screening for other disorder: Secondary | ICD-10-CM | POA: Diagnosis not present

## 2014-08-03 DIAGNOSIS — M6289 Other specified disorders of muscle: Secondary | ICD-10-CM | POA: Diagnosis not present

## 2014-08-17 DIAGNOSIS — M1611 Unilateral primary osteoarthritis, right hip: Secondary | ICD-10-CM | POA: Diagnosis not present

## 2014-08-17 DIAGNOSIS — M1612 Unilateral primary osteoarthritis, left hip: Secondary | ICD-10-CM | POA: Diagnosis not present

## 2014-08-17 DIAGNOSIS — Z96651 Presence of right artificial knee joint: Secondary | ICD-10-CM | POA: Diagnosis not present

## 2014-08-17 DIAGNOSIS — M25561 Pain in right knee: Secondary | ICD-10-CM | POA: Diagnosis not present

## 2014-08-21 DIAGNOSIS — Z13220 Encounter for screening for lipoid disorders: Secondary | ICD-10-CM | POA: Diagnosis not present

## 2014-08-21 DIAGNOSIS — Z1329 Encounter for screening for other suspected endocrine disorder: Secondary | ICD-10-CM | POA: Diagnosis not present

## 2014-08-21 DIAGNOSIS — Z125 Encounter for screening for malignant neoplasm of prostate: Secondary | ICD-10-CM | POA: Diagnosis not present

## 2014-08-24 DIAGNOSIS — M1611 Unilateral primary osteoarthritis, right hip: Secondary | ICD-10-CM | POA: Diagnosis not present

## 2014-08-24 DIAGNOSIS — M1711 Unilateral primary osteoarthritis, right knee: Secondary | ICD-10-CM | POA: Diagnosis not present

## 2014-08-24 DIAGNOSIS — Z7982 Long term (current) use of aspirin: Secondary | ICD-10-CM | POA: Diagnosis not present

## 2014-08-24 DIAGNOSIS — Z96653 Presence of artificial knee joint, bilateral: Secondary | ICD-10-CM | POA: Diagnosis not present

## 2014-08-24 DIAGNOSIS — M25551 Pain in right hip: Secondary | ICD-10-CM | POA: Diagnosis not present

## 2014-08-24 DIAGNOSIS — M25559 Pain in unspecified hip: Secondary | ICD-10-CM | POA: Diagnosis not present

## 2014-10-12 DIAGNOSIS — Z471 Aftercare following joint replacement surgery: Secondary | ICD-10-CM | POA: Diagnosis not present

## 2014-10-12 DIAGNOSIS — Z96652 Presence of left artificial knee joint: Secondary | ICD-10-CM | POA: Diagnosis not present

## 2014-10-19 DIAGNOSIS — M25559 Pain in unspecified hip: Secondary | ICD-10-CM | POA: Diagnosis not present

## 2014-10-19 DIAGNOSIS — M25551 Pain in right hip: Secondary | ICD-10-CM | POA: Diagnosis not present

## 2014-10-19 DIAGNOSIS — Z01810 Encounter for preprocedural cardiovascular examination: Secondary | ICD-10-CM | POA: Diagnosis not present

## 2014-10-19 DIAGNOSIS — Z01818 Encounter for other preprocedural examination: Secondary | ICD-10-CM | POA: Diagnosis not present

## 2014-10-23 DIAGNOSIS — Z23 Encounter for immunization: Secondary | ICD-10-CM | POA: Diagnosis not present

## 2014-11-06 DIAGNOSIS — G8918 Other acute postprocedural pain: Secondary | ICD-10-CM | POA: Diagnosis not present

## 2014-11-06 DIAGNOSIS — M25559 Pain in unspecified hip: Secondary | ICD-10-CM | POA: Diagnosis not present

## 2014-11-06 DIAGNOSIS — Z8619 Personal history of other infectious and parasitic diseases: Secondary | ICD-10-CM | POA: Diagnosis not present

## 2014-11-06 DIAGNOSIS — M1711 Unilateral primary osteoarthritis, right knee: Secondary | ICD-10-CM | POA: Diagnosis not present

## 2014-11-06 DIAGNOSIS — Z471 Aftercare following joint replacement surgery: Secondary | ICD-10-CM | POA: Diagnosis not present

## 2014-11-06 DIAGNOSIS — D62 Acute posthemorrhagic anemia: Secondary | ICD-10-CM | POA: Diagnosis not present

## 2014-11-06 DIAGNOSIS — M25551 Pain in right hip: Secondary | ICD-10-CM | POA: Diagnosis not present

## 2014-11-06 DIAGNOSIS — Z96653 Presence of artificial knee joint, bilateral: Secondary | ICD-10-CM | POA: Diagnosis not present

## 2014-11-06 DIAGNOSIS — F419 Anxiety disorder, unspecified: Secondary | ICD-10-CM | POA: Diagnosis not present

## 2014-11-06 DIAGNOSIS — Z96641 Presence of right artificial hip joint: Secondary | ICD-10-CM | POA: Diagnosis not present

## 2014-11-06 DIAGNOSIS — M1611 Unilateral primary osteoarthritis, right hip: Secondary | ICD-10-CM | POA: Diagnosis not present

## 2014-11-09 DIAGNOSIS — M62551 Muscle wasting and atrophy, not elsewhere classified, right thigh: Secondary | ICD-10-CM | POA: Diagnosis not present

## 2014-11-09 DIAGNOSIS — Z96641 Presence of right artificial hip joint: Secondary | ICD-10-CM | POA: Diagnosis not present

## 2014-11-09 DIAGNOSIS — R2689 Other abnormalities of gait and mobility: Secondary | ICD-10-CM | POA: Diagnosis not present

## 2014-11-09 DIAGNOSIS — M25551 Pain in right hip: Secondary | ICD-10-CM | POA: Diagnosis not present

## 2014-11-13 DIAGNOSIS — R2689 Other abnormalities of gait and mobility: Secondary | ICD-10-CM | POA: Diagnosis not present

## 2014-11-13 DIAGNOSIS — M62551 Muscle wasting and atrophy, not elsewhere classified, right thigh: Secondary | ICD-10-CM | POA: Diagnosis not present

## 2014-11-13 DIAGNOSIS — M25551 Pain in right hip: Secondary | ICD-10-CM | POA: Diagnosis not present

## 2014-11-13 DIAGNOSIS — Z96641 Presence of right artificial hip joint: Secondary | ICD-10-CM | POA: Diagnosis not present

## 2014-11-16 DIAGNOSIS — R2689 Other abnormalities of gait and mobility: Secondary | ICD-10-CM | POA: Diagnosis not present

## 2014-11-16 DIAGNOSIS — Z96641 Presence of right artificial hip joint: Secondary | ICD-10-CM | POA: Diagnosis not present

## 2014-11-16 DIAGNOSIS — M62551 Muscle wasting and atrophy, not elsewhere classified, right thigh: Secondary | ICD-10-CM | POA: Diagnosis not present

## 2014-11-16 DIAGNOSIS — M25551 Pain in right hip: Secondary | ICD-10-CM | POA: Diagnosis not present

## 2014-11-21 DIAGNOSIS — M62551 Muscle wasting and atrophy, not elsewhere classified, right thigh: Secondary | ICD-10-CM | POA: Diagnosis not present

## 2014-11-21 DIAGNOSIS — Z96641 Presence of right artificial hip joint: Secondary | ICD-10-CM | POA: Diagnosis not present

## 2014-11-21 DIAGNOSIS — M25551 Pain in right hip: Secondary | ICD-10-CM | POA: Diagnosis not present

## 2014-11-21 DIAGNOSIS — R2689 Other abnormalities of gait and mobility: Secondary | ICD-10-CM | POA: Diagnosis not present

## 2014-11-22 DIAGNOSIS — Z96641 Presence of right artificial hip joint: Secondary | ICD-10-CM | POA: Diagnosis not present

## 2014-11-22 DIAGNOSIS — Z471 Aftercare following joint replacement surgery: Secondary | ICD-10-CM | POA: Diagnosis not present

## 2014-11-23 DIAGNOSIS — Z96641 Presence of right artificial hip joint: Secondary | ICD-10-CM | POA: Diagnosis not present

## 2014-11-23 DIAGNOSIS — M62551 Muscle wasting and atrophy, not elsewhere classified, right thigh: Secondary | ICD-10-CM | POA: Diagnosis not present

## 2014-11-23 DIAGNOSIS — R2689 Other abnormalities of gait and mobility: Secondary | ICD-10-CM | POA: Diagnosis not present

## 2014-11-23 DIAGNOSIS — M25551 Pain in right hip: Secondary | ICD-10-CM | POA: Diagnosis not present

## 2014-11-28 DIAGNOSIS — Z96641 Presence of right artificial hip joint: Secondary | ICD-10-CM | POA: Diagnosis not present

## 2014-11-28 DIAGNOSIS — M62551 Muscle wasting and atrophy, not elsewhere classified, right thigh: Secondary | ICD-10-CM | POA: Diagnosis not present

## 2014-11-28 DIAGNOSIS — R2689 Other abnormalities of gait and mobility: Secondary | ICD-10-CM | POA: Diagnosis not present

## 2014-11-28 DIAGNOSIS — M25551 Pain in right hip: Secondary | ICD-10-CM | POA: Diagnosis not present

## 2014-12-04 DIAGNOSIS — M25551 Pain in right hip: Secondary | ICD-10-CM | POA: Diagnosis not present

## 2014-12-04 DIAGNOSIS — M62551 Muscle wasting and atrophy, not elsewhere classified, right thigh: Secondary | ICD-10-CM | POA: Diagnosis not present

## 2014-12-04 DIAGNOSIS — R2689 Other abnormalities of gait and mobility: Secondary | ICD-10-CM | POA: Diagnosis not present

## 2014-12-04 DIAGNOSIS — Z96641 Presence of right artificial hip joint: Secondary | ICD-10-CM | POA: Diagnosis not present

## 2014-12-08 DIAGNOSIS — Z96641 Presence of right artificial hip joint: Secondary | ICD-10-CM | POA: Diagnosis not present

## 2014-12-08 DIAGNOSIS — M62551 Muscle wasting and atrophy, not elsewhere classified, right thigh: Secondary | ICD-10-CM | POA: Diagnosis not present

## 2014-12-08 DIAGNOSIS — R2689 Other abnormalities of gait and mobility: Secondary | ICD-10-CM | POA: Diagnosis not present

## 2014-12-08 DIAGNOSIS — M25551 Pain in right hip: Secondary | ICD-10-CM | POA: Diagnosis not present

## 2014-12-21 DIAGNOSIS — L309 Dermatitis, unspecified: Secondary | ICD-10-CM | POA: Diagnosis not present

## 2014-12-28 DIAGNOSIS — Z96641 Presence of right artificial hip joint: Secondary | ICD-10-CM | POA: Diagnosis not present

## 2014-12-28 DIAGNOSIS — Z471 Aftercare following joint replacement surgery: Secondary | ICD-10-CM | POA: Diagnosis not present

## 2015-02-21 DIAGNOSIS — Z96641 Presence of right artificial hip joint: Secondary | ICD-10-CM | POA: Diagnosis not present

## 2015-02-21 DIAGNOSIS — Z96643 Presence of artificial hip joint, bilateral: Secondary | ICD-10-CM | POA: Diagnosis not present

## 2015-02-21 DIAGNOSIS — Z471 Aftercare following joint replacement surgery: Secondary | ICD-10-CM | POA: Diagnosis not present

## 2015-05-22 DIAGNOSIS — L820 Inflamed seborrheic keratosis: Secondary | ICD-10-CM | POA: Diagnosis not present

## 2015-06-21 DIAGNOSIS — L723 Sebaceous cyst: Secondary | ICD-10-CM | POA: Diagnosis not present

## 2015-11-07 DIAGNOSIS — Z471 Aftercare following joint replacement surgery: Secondary | ICD-10-CM | POA: Diagnosis not present

## 2015-11-07 DIAGNOSIS — Z96641 Presence of right artificial hip joint: Secondary | ICD-10-CM | POA: Diagnosis not present

## 2015-11-22 DIAGNOSIS — Z23 Encounter for immunization: Secondary | ICD-10-CM | POA: Diagnosis not present

## 2016-02-06 IMAGING — MR MR KNEE*R* W/O CM
4 series · 35 of 40 positions shown · non-contrast
Comparison: None.

CLINICAL DATA: Zimmer protocol for pre-surgical planning for knee
replacement.

EXAM:
MRI OF THE RIGHT KNEE WITHOUT CONTRAST
TECHNIQUE: Multiplanar, multisequence MR imaging of the knee was performed. No
intravenous contrast was administered.

[Series 4: knee true sag · sagittal · 1.0mm · 0.39mm/px · 20 of 120 slices shown]
[im 1/120]
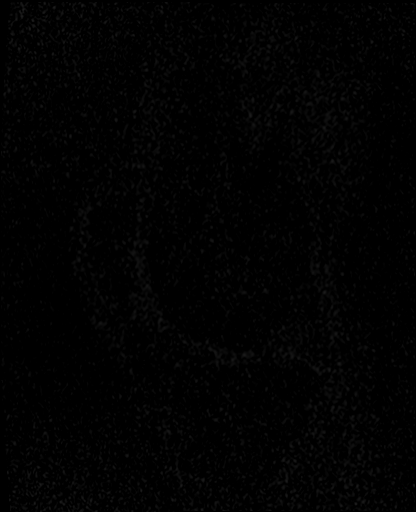
[im 5/120]
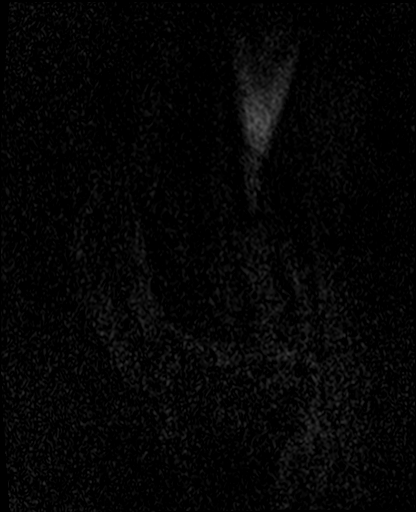
[im 10/120]
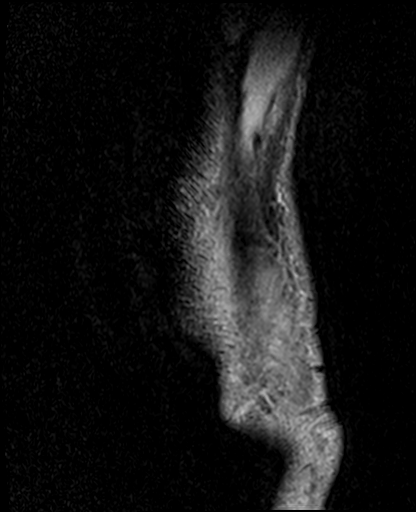
[im 15/120]
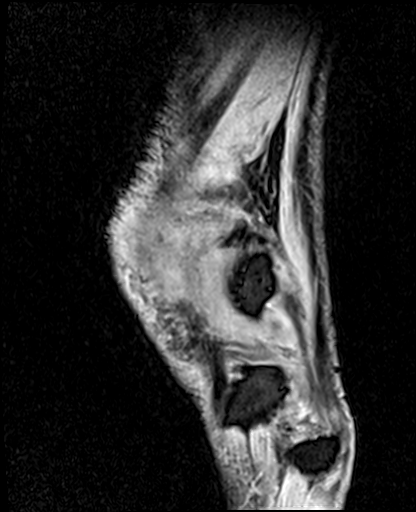
[im 20/120]
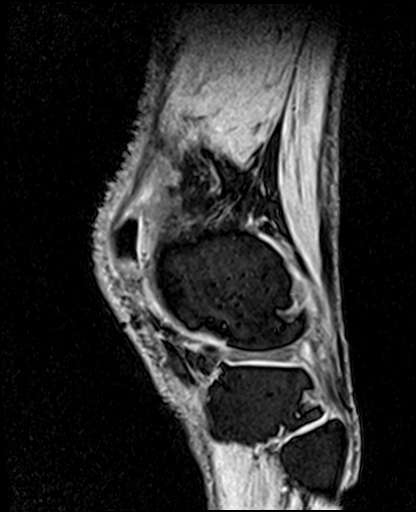
[im 25/120]
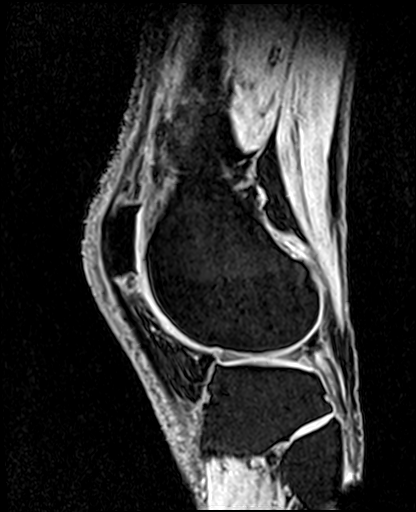
[im 30/120]
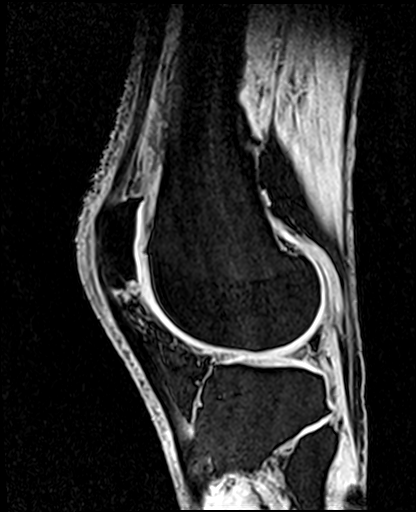
[im 35/120]
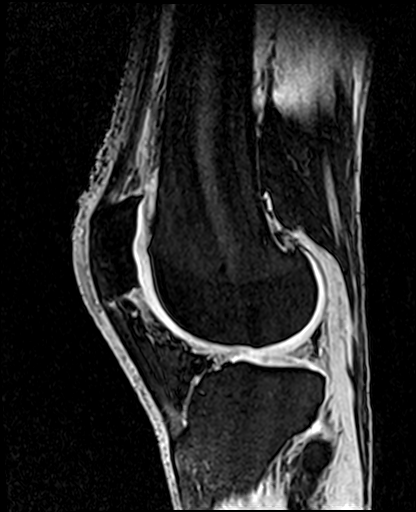
[im 40/120]
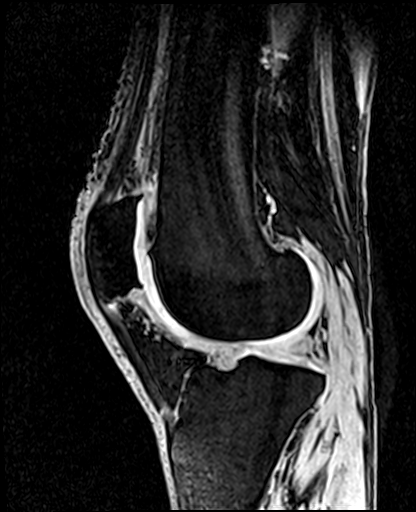
[im 45/120]
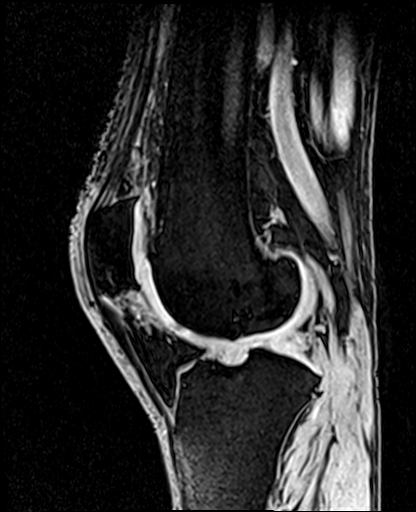
[im 50/120]
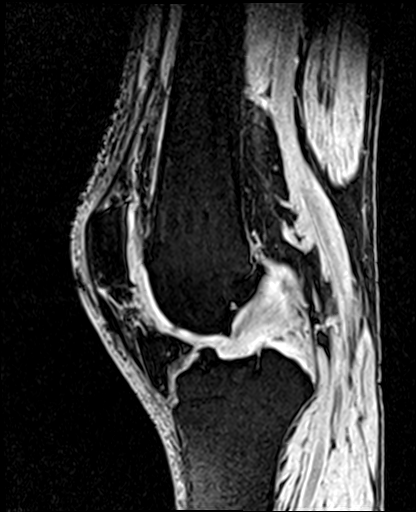
[im 55/120]
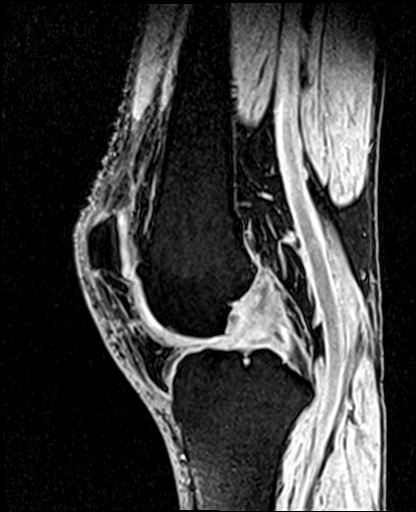
[im 60/120]
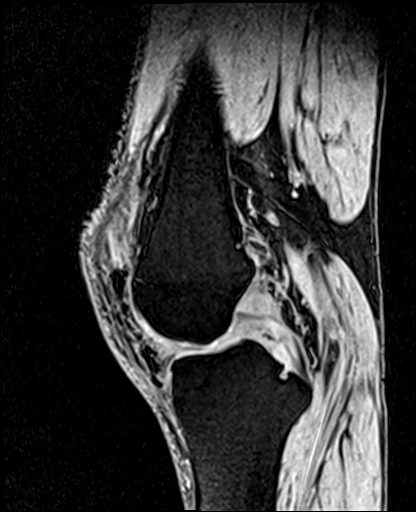
[im 65/120]
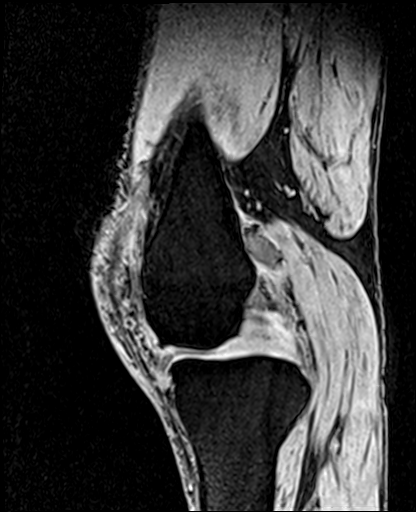
[im 70/120]
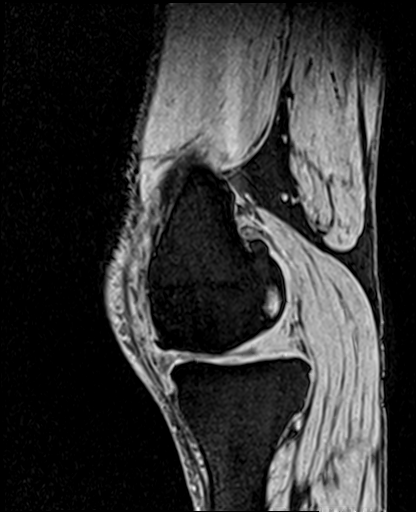
[im 75/120]
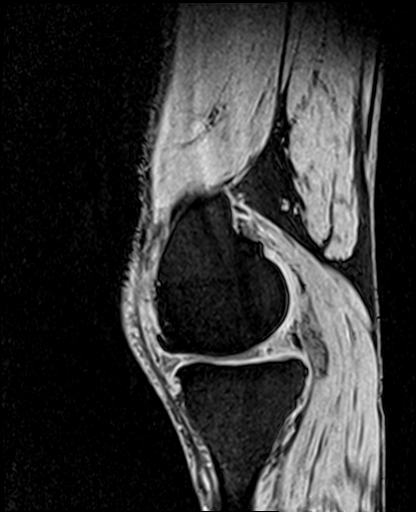
[im 80/120]
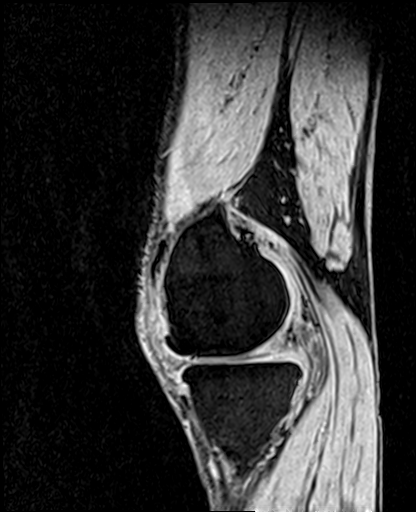
[im 85/120]
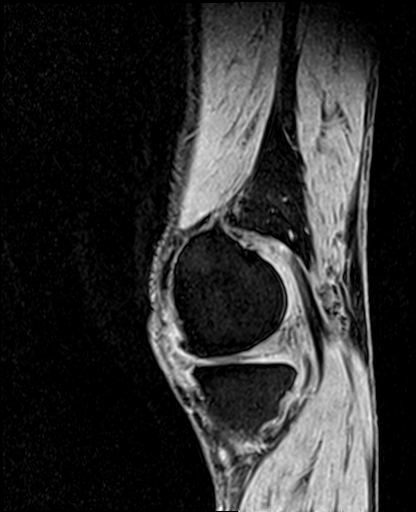
[im 100/120]
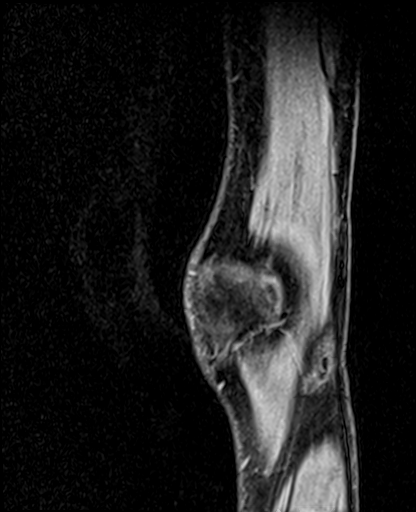
[im 115/120]
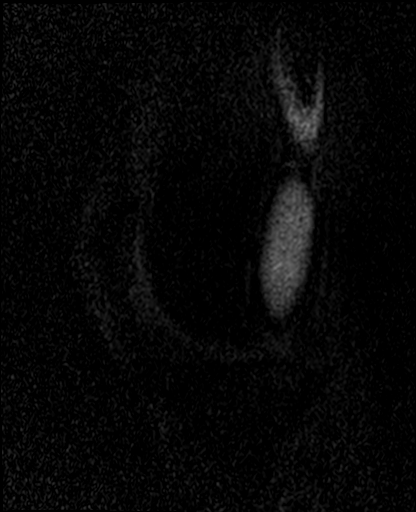

[Series 7: T1 · axial · 5.0mm · 1.02mm/px · z∈[-58,+86]mm · 5 of 25 slices shown (1 of 2)]
[im 1/25]
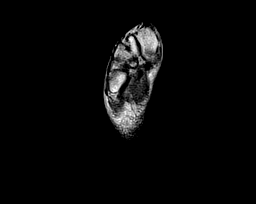
[im 7/25]
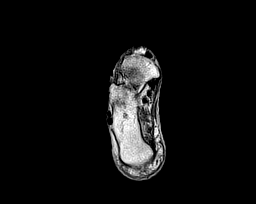
[im 13/25]
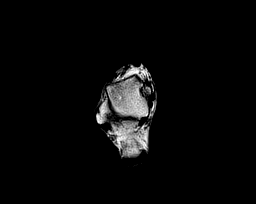
[im 19/25]
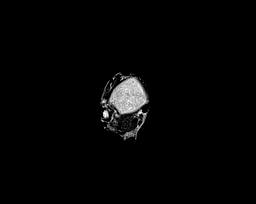
[im 25/25]
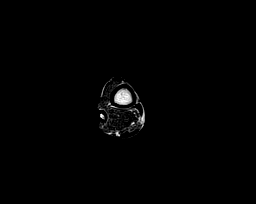

[Series 12: low res knee · sagittal · 4.0mm · 1.02mm/px · 6 of 30 slices shown]
[im 1/30]
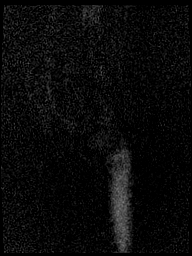
[im 6/30]
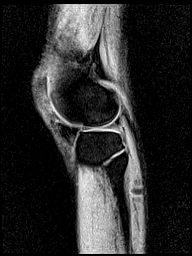
[im 12/30]
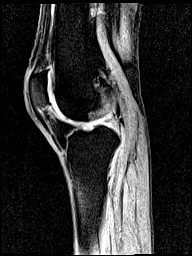
[im 18/30]
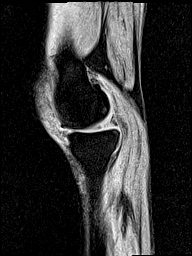
[im 24/30]
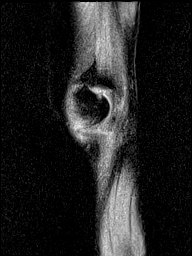
[im 30/30]
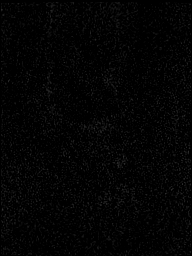

[Series 17: T1 · axial · 5.0mm · 1.41mm/px · z∈[+830,+944]mm · 4 of 20 slices shown (2 of 2)]
[im 1/20]
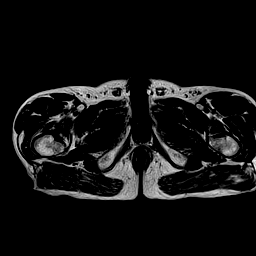
[im 7/20]
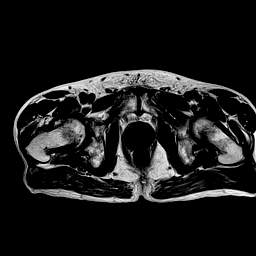
[im 13/20]
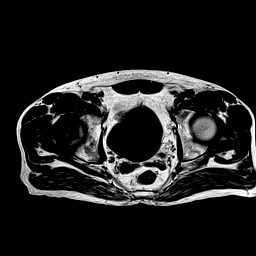
[im 20/20]
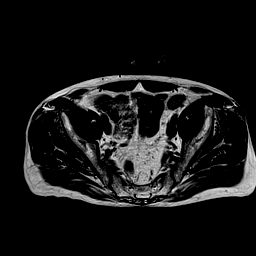

[35 of 40 positions shown; findings below may reference images not displayed]

FINDINGS: There is not a diagnostic examination of the right knee. The
examination was performed for pre-surgical planning prior to knee
replacement. Images are available for planning purposes. The MRI
images are not for diagnostic purposes.

There is osteoarthritis of the right hip.
IMPRESSION: Zimmer protocol MRI of the right knee for pre-surgical planning
prior to knee replacement. The images are available for planning
purposes, but are not obtained for diagnostic interpretation.

## 2016-02-25 DIAGNOSIS — Z0289 Encounter for other administrative examinations: Secondary | ICD-10-CM | POA: Diagnosis not present

## 2016-07-07 DIAGNOSIS — H524 Presbyopia: Secondary | ICD-10-CM | POA: Diagnosis not present

## 2016-11-06 DIAGNOSIS — Z471 Aftercare following joint replacement surgery: Secondary | ICD-10-CM | POA: Diagnosis not present

## 2016-11-06 DIAGNOSIS — Z96641 Presence of right artificial hip joint: Secondary | ICD-10-CM | POA: Diagnosis not present

## 2016-11-17 DIAGNOSIS — Z6822 Body mass index (BMI) 22.0-22.9, adult: Secondary | ICD-10-CM | POA: Diagnosis not present

## 2016-11-17 DIAGNOSIS — J40 Bronchitis, not specified as acute or chronic: Secondary | ICD-10-CM | POA: Diagnosis not present

## 2016-11-17 DIAGNOSIS — R030 Elevated blood-pressure reading, without diagnosis of hypertension: Secondary | ICD-10-CM | POA: Diagnosis not present

## 2016-11-17 DIAGNOSIS — J329 Chronic sinusitis, unspecified: Secondary | ICD-10-CM | POA: Diagnosis not present

## 2016-12-17 DIAGNOSIS — Z23 Encounter for immunization: Secondary | ICD-10-CM | POA: Diagnosis not present

## 2017-01-21 DIAGNOSIS — L111 Transient acantholytic dermatosis [Grover]: Secondary | ICD-10-CM | POA: Diagnosis not present

## 2017-01-21 DIAGNOSIS — Z6822 Body mass index (BMI) 22.0-22.9, adult: Secondary | ICD-10-CM | POA: Diagnosis not present

## 2017-04-08 DIAGNOSIS — Z0289 Encounter for other administrative examinations: Secondary | ICD-10-CM | POA: Diagnosis not present

## 2017-04-08 DIAGNOSIS — L111 Transient acantholytic dermatosis [Grover]: Secondary | ICD-10-CM | POA: Diagnosis not present

## 2017-04-08 DIAGNOSIS — F4322 Adjustment disorder with anxiety: Secondary | ICD-10-CM | POA: Diagnosis not present

## 2017-04-08 DIAGNOSIS — Z6822 Body mass index (BMI) 22.0-22.9, adult: Secondary | ICD-10-CM | POA: Diagnosis not present

## 2017-05-14 DIAGNOSIS — M25512 Pain in left shoulder: Secondary | ICD-10-CM | POA: Diagnosis not present

## 2017-08-19 DIAGNOSIS — M25512 Pain in left shoulder: Secondary | ICD-10-CM | POA: Diagnosis not present

## 2017-11-04 DIAGNOSIS — Z23 Encounter for immunization: Secondary | ICD-10-CM | POA: Diagnosis not present

## 2017-11-30 DIAGNOSIS — Z6823 Body mass index (BMI) 23.0-23.9, adult: Secondary | ICD-10-CM | POA: Diagnosis not present

## 2017-11-30 DIAGNOSIS — M25512 Pain in left shoulder: Secondary | ICD-10-CM | POA: Diagnosis not present

## 2017-12-21 DIAGNOSIS — M19012 Primary osteoarthritis, left shoulder: Secondary | ICD-10-CM | POA: Diagnosis not present

## 2018-03-23 DIAGNOSIS — M19012 Primary osteoarthritis, left shoulder: Secondary | ICD-10-CM | POA: Diagnosis not present

## 2018-05-27 DIAGNOSIS — L111 Transient acantholytic dermatosis [Grover]: Secondary | ICD-10-CM | POA: Diagnosis not present

## 2018-05-27 DIAGNOSIS — E7800 Pure hypercholesterolemia, unspecified: Secondary | ICD-10-CM | POA: Diagnosis not present

## 2018-05-27 DIAGNOSIS — M25512 Pain in left shoulder: Secondary | ICD-10-CM | POA: Diagnosis not present

## 2018-05-27 DIAGNOSIS — Z0000 Encounter for general adult medical examination without abnormal findings: Secondary | ICD-10-CM | POA: Diagnosis not present

## 2018-05-27 DIAGNOSIS — Z6822 Body mass index (BMI) 22.0-22.9, adult: Secondary | ICD-10-CM | POA: Diagnosis not present

## 2018-05-27 DIAGNOSIS — F4322 Adjustment disorder with anxiety: Secondary | ICD-10-CM | POA: Diagnosis not present

## 2018-05-27 DIAGNOSIS — Z23 Encounter for immunization: Secondary | ICD-10-CM | POA: Diagnosis not present

## 2018-06-16 DIAGNOSIS — E785 Hyperlipidemia, unspecified: Secondary | ICD-10-CM | POA: Diagnosis not present

## 2018-06-16 DIAGNOSIS — Z79899 Other long term (current) drug therapy: Secondary | ICD-10-CM | POA: Diagnosis not present

## 2018-06-16 DIAGNOSIS — Z125 Encounter for screening for malignant neoplasm of prostate: Secondary | ICD-10-CM | POA: Diagnosis not present

## 2018-06-23 DIAGNOSIS — M19012 Primary osteoarthritis, left shoulder: Secondary | ICD-10-CM | POA: Diagnosis not present

## 2018-06-28 DIAGNOSIS — M19012 Primary osteoarthritis, left shoulder: Secondary | ICD-10-CM | POA: Diagnosis not present

## 2018-06-28 DIAGNOSIS — M67312 Transient synovitis, left shoulder: Secondary | ICD-10-CM | POA: Diagnosis not present

## 2018-06-28 DIAGNOSIS — M25512 Pain in left shoulder: Secondary | ICD-10-CM | POA: Diagnosis not present

## 2018-07-12 DIAGNOSIS — E559 Vitamin D deficiency, unspecified: Secondary | ICD-10-CM | POA: Diagnosis not present

## 2018-07-12 DIAGNOSIS — Z01818 Encounter for other preprocedural examination: Secondary | ICD-10-CM | POA: Diagnosis not present

## 2018-07-12 DIAGNOSIS — M79609 Pain in unspecified limb: Secondary | ICD-10-CM | POA: Diagnosis not present

## 2018-07-12 DIAGNOSIS — Z01811 Encounter for preprocedural respiratory examination: Secondary | ICD-10-CM | POA: Diagnosis not present

## 2018-07-12 DIAGNOSIS — R52 Pain, unspecified: Secondary | ICD-10-CM | POA: Diagnosis not present

## 2018-07-12 DIAGNOSIS — Z79899 Other long term (current) drug therapy: Secondary | ICD-10-CM | POA: Diagnosis not present

## 2018-08-10 DIAGNOSIS — E7800 Pure hypercholesterolemia, unspecified: Secondary | ICD-10-CM | POA: Diagnosis not present

## 2018-08-10 DIAGNOSIS — M19012 Primary osteoarthritis, left shoulder: Secondary | ICD-10-CM | POA: Diagnosis not present

## 2018-08-10 DIAGNOSIS — K219 Gastro-esophageal reflux disease without esophagitis: Secondary | ICD-10-CM | POA: Diagnosis not present

## 2018-08-10 DIAGNOSIS — I10 Essential (primary) hypertension: Secondary | ICD-10-CM | POA: Diagnosis not present

## 2018-08-10 DIAGNOSIS — Z471 Aftercare following joint replacement surgery: Secondary | ICD-10-CM | POA: Diagnosis not present

## 2018-08-10 DIAGNOSIS — Z87891 Personal history of nicotine dependence: Secondary | ICD-10-CM | POA: Diagnosis not present

## 2018-08-10 DIAGNOSIS — G8918 Other acute postprocedural pain: Secondary | ICD-10-CM | POA: Diagnosis not present

## 2018-08-10 DIAGNOSIS — F329 Major depressive disorder, single episode, unspecified: Secondary | ICD-10-CM | POA: Diagnosis not present

## 2018-08-10 DIAGNOSIS — M65812 Other synovitis and tenosynovitis, left shoulder: Secondary | ICD-10-CM | POA: Diagnosis not present

## 2018-08-10 DIAGNOSIS — Z96612 Presence of left artificial shoulder joint: Secondary | ICD-10-CM | POA: Diagnosis not present

## 2018-08-16 DIAGNOSIS — M6281 Muscle weakness (generalized): Secondary | ICD-10-CM | POA: Diagnosis not present

## 2018-08-16 DIAGNOSIS — M25612 Stiffness of left shoulder, not elsewhere classified: Secondary | ICD-10-CM | POA: Diagnosis not present

## 2018-08-16 DIAGNOSIS — M25512 Pain in left shoulder: Secondary | ICD-10-CM | POA: Diagnosis not present

## 2018-08-18 DIAGNOSIS — M25512 Pain in left shoulder: Secondary | ICD-10-CM | POA: Diagnosis not present

## 2018-08-18 DIAGNOSIS — M25612 Stiffness of left shoulder, not elsewhere classified: Secondary | ICD-10-CM | POA: Diagnosis not present

## 2018-08-18 DIAGNOSIS — M6281 Muscle weakness (generalized): Secondary | ICD-10-CM | POA: Diagnosis not present

## 2018-08-20 DIAGNOSIS — M25512 Pain in left shoulder: Secondary | ICD-10-CM | POA: Diagnosis not present

## 2018-08-20 DIAGNOSIS — M25612 Stiffness of left shoulder, not elsewhere classified: Secondary | ICD-10-CM | POA: Diagnosis not present

## 2018-08-20 DIAGNOSIS — M6281 Muscle weakness (generalized): Secondary | ICD-10-CM | POA: Diagnosis not present

## 2018-08-23 DIAGNOSIS — M6281 Muscle weakness (generalized): Secondary | ICD-10-CM | POA: Diagnosis not present

## 2018-08-23 DIAGNOSIS — M25512 Pain in left shoulder: Secondary | ICD-10-CM | POA: Diagnosis not present

## 2018-08-23 DIAGNOSIS — M25612 Stiffness of left shoulder, not elsewhere classified: Secondary | ICD-10-CM | POA: Diagnosis not present

## 2018-08-25 DIAGNOSIS — M25612 Stiffness of left shoulder, not elsewhere classified: Secondary | ICD-10-CM | POA: Diagnosis not present

## 2018-08-25 DIAGNOSIS — M6281 Muscle weakness (generalized): Secondary | ICD-10-CM | POA: Diagnosis not present

## 2018-08-25 DIAGNOSIS — M25512 Pain in left shoulder: Secondary | ICD-10-CM | POA: Diagnosis not present

## 2018-08-27 DIAGNOSIS — M25512 Pain in left shoulder: Secondary | ICD-10-CM | POA: Diagnosis not present

## 2018-08-27 DIAGNOSIS — M25612 Stiffness of left shoulder, not elsewhere classified: Secondary | ICD-10-CM | POA: Diagnosis not present

## 2018-08-27 DIAGNOSIS — M6281 Muscle weakness (generalized): Secondary | ICD-10-CM | POA: Diagnosis not present

## 2018-08-30 DIAGNOSIS — M6281 Muscle weakness (generalized): Secondary | ICD-10-CM | POA: Diagnosis not present

## 2018-08-30 DIAGNOSIS — M25512 Pain in left shoulder: Secondary | ICD-10-CM | POA: Diagnosis not present

## 2018-08-30 DIAGNOSIS — M25612 Stiffness of left shoulder, not elsewhere classified: Secondary | ICD-10-CM | POA: Diagnosis not present

## 2018-09-02 DIAGNOSIS — M6281 Muscle weakness (generalized): Secondary | ICD-10-CM | POA: Diagnosis not present

## 2018-09-02 DIAGNOSIS — M25612 Stiffness of left shoulder, not elsewhere classified: Secondary | ICD-10-CM | POA: Diagnosis not present

## 2018-09-02 DIAGNOSIS — M25512 Pain in left shoulder: Secondary | ICD-10-CM | POA: Diagnosis not present

## 2018-09-06 DIAGNOSIS — M6281 Muscle weakness (generalized): Secondary | ICD-10-CM | POA: Diagnosis not present

## 2018-09-06 DIAGNOSIS — M25612 Stiffness of left shoulder, not elsewhere classified: Secondary | ICD-10-CM | POA: Diagnosis not present

## 2018-09-06 DIAGNOSIS — M25512 Pain in left shoulder: Secondary | ICD-10-CM | POA: Diagnosis not present

## 2018-09-09 DIAGNOSIS — M25512 Pain in left shoulder: Secondary | ICD-10-CM | POA: Diagnosis not present

## 2018-09-09 DIAGNOSIS — M25612 Stiffness of left shoulder, not elsewhere classified: Secondary | ICD-10-CM | POA: Diagnosis not present

## 2018-09-09 DIAGNOSIS — M6281 Muscle weakness (generalized): Secondary | ICD-10-CM | POA: Diagnosis not present

## 2018-09-13 DIAGNOSIS — R05 Cough: Secondary | ICD-10-CM | POA: Diagnosis not present

## 2018-09-16 DIAGNOSIS — M25512 Pain in left shoulder: Secondary | ICD-10-CM | POA: Diagnosis not present

## 2018-09-16 DIAGNOSIS — M6281 Muscle weakness (generalized): Secondary | ICD-10-CM | POA: Diagnosis not present

## 2018-09-16 DIAGNOSIS — M25612 Stiffness of left shoulder, not elsewhere classified: Secondary | ICD-10-CM | POA: Diagnosis not present

## 2018-09-21 DIAGNOSIS — M25512 Pain in left shoulder: Secondary | ICD-10-CM | POA: Diagnosis not present

## 2018-09-21 DIAGNOSIS — M6281 Muscle weakness (generalized): Secondary | ICD-10-CM | POA: Diagnosis not present

## 2018-09-21 DIAGNOSIS — M25612 Stiffness of left shoulder, not elsewhere classified: Secondary | ICD-10-CM | POA: Diagnosis not present

## 2018-09-22 DIAGNOSIS — M19012 Primary osteoarthritis, left shoulder: Secondary | ICD-10-CM | POA: Diagnosis not present

## 2018-09-29 DIAGNOSIS — M25512 Pain in left shoulder: Secondary | ICD-10-CM | POA: Diagnosis not present

## 2018-09-29 DIAGNOSIS — M6281 Muscle weakness (generalized): Secondary | ICD-10-CM | POA: Diagnosis not present

## 2018-09-29 DIAGNOSIS — M25612 Stiffness of left shoulder, not elsewhere classified: Secondary | ICD-10-CM | POA: Diagnosis not present

## 2018-10-19 DIAGNOSIS — Z23 Encounter for immunization: Secondary | ICD-10-CM | POA: Diagnosis not present

## 2018-10-19 DIAGNOSIS — L820 Inflamed seborrheic keratosis: Secondary | ICD-10-CM | POA: Diagnosis not present

## 2019-02-03 DIAGNOSIS — M25512 Pain in left shoulder: Secondary | ICD-10-CM | POA: Diagnosis not present

## 2019-02-03 DIAGNOSIS — M19012 Primary osteoarthritis, left shoulder: Secondary | ICD-10-CM | POA: Diagnosis not present

## 2019-06-08 DIAGNOSIS — Z6823 Body mass index (BMI) 23.0-23.9, adult: Secondary | ICD-10-CM | POA: Diagnosis not present

## 2019-06-08 DIAGNOSIS — E7800 Pure hypercholesterolemia, unspecified: Secondary | ICD-10-CM | POA: Diagnosis not present

## 2019-06-08 DIAGNOSIS — F4322 Adjustment disorder with anxiety: Secondary | ICD-10-CM | POA: Diagnosis not present

## 2019-06-08 DIAGNOSIS — Z79899 Other long term (current) drug therapy: Secondary | ICD-10-CM | POA: Diagnosis not present

## 2019-06-08 DIAGNOSIS — Z125 Encounter for screening for malignant neoplasm of prostate: Secondary | ICD-10-CM | POA: Diagnosis not present

## 2019-06-08 DIAGNOSIS — Z0000 Encounter for general adult medical examination without abnormal findings: Secondary | ICD-10-CM | POA: Diagnosis not present

## 2019-11-02 DIAGNOSIS — Z23 Encounter for immunization: Secondary | ICD-10-CM | POA: Diagnosis not present

## 2020-01-12 DIAGNOSIS — L820 Inflamed seborrheic keratosis: Secondary | ICD-10-CM | POA: Diagnosis not present

## 2020-04-27 DIAGNOSIS — Z6822 Body mass index (BMI) 22.0-22.9, adult: Secondary | ICD-10-CM | POA: Diagnosis not present

## 2020-04-27 DIAGNOSIS — M5431 Sciatica, right side: Secondary | ICD-10-CM | POA: Diagnosis not present

## 2020-05-10 DIAGNOSIS — Z6822 Body mass index (BMI) 22.0-22.9, adult: Secondary | ICD-10-CM | POA: Diagnosis not present

## 2020-05-10 DIAGNOSIS — M5432 Sciatica, left side: Secondary | ICD-10-CM | POA: Diagnosis not present

## 2020-09-03 DIAGNOSIS — Z6823 Body mass index (BMI) 23.0-23.9, adult: Secondary | ICD-10-CM | POA: Diagnosis not present

## 2020-09-03 DIAGNOSIS — L299 Pruritus, unspecified: Secondary | ICD-10-CM | POA: Diagnosis not present

## 2020-11-06 DIAGNOSIS — Z0000 Encounter for general adult medical examination without abnormal findings: Secondary | ICD-10-CM | POA: Diagnosis not present

## 2020-11-06 DIAGNOSIS — S80819A Abrasion, unspecified lower leg, initial encounter: Secondary | ICD-10-CM | POA: Diagnosis not present

## 2020-11-06 DIAGNOSIS — L820 Inflamed seborrheic keratosis: Secondary | ICD-10-CM | POA: Diagnosis not present

## 2020-11-06 DIAGNOSIS — Z23 Encounter for immunization: Secondary | ICD-10-CM | POA: Diagnosis not present

## 2020-11-06 DIAGNOSIS — Z6822 Body mass index (BMI) 22.0-22.9, adult: Secondary | ICD-10-CM | POA: Diagnosis not present

## 2020-11-23 DIAGNOSIS — Z23 Encounter for immunization: Secondary | ICD-10-CM | POA: Diagnosis not present

## 2021-05-14 DIAGNOSIS — L300 Nummular dermatitis: Secondary | ICD-10-CM | POA: Diagnosis not present

## 2021-07-24 DIAGNOSIS — M5432 Sciatica, left side: Secondary | ICD-10-CM | POA: Diagnosis not present

## 2021-07-24 DIAGNOSIS — Z6823 Body mass index (BMI) 23.0-23.9, adult: Secondary | ICD-10-CM | POA: Diagnosis not present

## 2021-07-24 DIAGNOSIS — Z9181 History of falling: Secondary | ICD-10-CM | POA: Diagnosis not present

## 2021-08-08 DIAGNOSIS — M5432 Sciatica, left side: Secondary | ICD-10-CM | POA: Diagnosis not present

## 2021-08-08 DIAGNOSIS — Z6823 Body mass index (BMI) 23.0-23.9, adult: Secondary | ICD-10-CM | POA: Diagnosis not present

## 2021-08-27 DIAGNOSIS — M5432 Sciatica, left side: Secondary | ICD-10-CM | POA: Diagnosis not present

## 2021-08-27 DIAGNOSIS — Z6823 Body mass index (BMI) 23.0-23.9, adult: Secondary | ICD-10-CM | POA: Diagnosis not present

## 2021-10-30 DIAGNOSIS — Z23 Encounter for immunization: Secondary | ICD-10-CM | POA: Diagnosis not present

## 2022-01-06 DIAGNOSIS — L820 Inflamed seborrheic keratosis: Secondary | ICD-10-CM | POA: Diagnosis not present

## 2022-02-13 DIAGNOSIS — H25813 Combined forms of age-related cataract, bilateral: Secondary | ICD-10-CM | POA: Diagnosis not present

## 2022-02-26 DIAGNOSIS — Z6822 Body mass index (BMI) 22.0-22.9, adult: Secondary | ICD-10-CM | POA: Diagnosis not present

## 2022-02-26 DIAGNOSIS — L509 Urticaria, unspecified: Secondary | ICD-10-CM | POA: Diagnosis not present

## 2022-02-26 DIAGNOSIS — Z0000 Encounter for general adult medical examination without abnormal findings: Secondary | ICD-10-CM | POA: Diagnosis not present

## 2022-02-26 DIAGNOSIS — J069 Acute upper respiratory infection, unspecified: Secondary | ICD-10-CM | POA: Diagnosis not present

## 2022-04-15 ENCOUNTER — Ambulatory Visit: Payer: Medicare Other | Admitting: Allergy

## 2022-04-15 ENCOUNTER — Encounter: Payer: Self-pay | Admitting: Allergy

## 2022-04-15 VITALS — BP 150/82 | HR 80 | Resp 18 | Ht 70.0 in | Wt 165.0 lb

## 2022-04-15 DIAGNOSIS — L299 Pruritus, unspecified: Secondary | ICD-10-CM

## 2022-04-15 DIAGNOSIS — L508 Other urticaria: Secondary | ICD-10-CM | POA: Diagnosis not present

## 2022-04-15 NOTE — Patient Instructions (Addendum)
-   at this time etiology of hives and swelling is unknown.  Hives can be caused by a variety of different triggers including illness/infection, foods, medications, stings, exercise, pressure, vibrations, extremes of temperature to name a few however majority of the time there is no identifiable trigger.  Your symptoms have been ongoing for >6 weeks making this chronic thus will obtain labwork to evaluate: CBC w diff, CMP, tryptase, hive panel, environmental panel, alpha-gal panel  - if the hives returns recommend taking primary antihistamine, Zyrtec 10mg  1 tab daily with secondary antihistamine, Pepcid 20mg  1 tab daily. If daily dosing is not effective enough then increase both medications to twice a day dosing.   - reserve Hydroxyzine 25mg  for as needed use for itch before bedtime  - should significant symptoms recur or new symptoms occur, a journal is to be kept recording any foods eaten, beverages consumed, medications taken, activities performed, and environmental conditions within a 6 hour time period prior to the onset of symptoms.   Follow-up in 2-3 months or sooner if needed

## 2022-04-15 NOTE — Progress Notes (Signed)
New Patient Note  RE: Ricky Davis MRN: [id number] DOB: 11-25-1950 Date of Office Visit: 04/15/2022   Primary care provider: Mateo Flow, MD  Chief Complaint: rash  History of present illness: Ricky Davis is a 72 y.o. male presenting today for evaluation of rash.    He states he has a different rash going on that seems to be different than the rash he follows with dermatology for.  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  During the winter the rash related to Grover's is worse.   He states this different rash comes and goes and started about 6 months or so ago.  He state within an 1-2 hours the rash is going and his skin is smooth again and not itching.  He has pictures of the rash that is red rash that is bumpy but also states gets these small dry white spots as well.  He states the rash can occur any time of the day and states was happening daily up until about a week or so ago. He has not had the rash in about 2 weeks he feels.  It is itchy.  Does not leave any marks/bruising once resolved.  No preceding illnesses.  No change in diet or medications, no bites/stings, no change in detergents/body products.  Has not noted any foods that trigger on the rash.  No joint aches/pains, no fevers, or swelling.  He has used benadryl that helps and he was prescribed hydroxyzine that has been helpful as it does make him sleepy.   He states he was only using it if it was itchy at night.  He states he was using the triamcinolone ointment on this rash that was prescribed by dermatology for his nummular dermatitis.   He does report mild seasonal allergy symptoms with nasal congestion/drainage.  He states he does have zyrtec at home.    Review of systems: Review of Systems  Constitutional: Negative.   HENT: Negative.    Eyes: Negative.   Respiratory: Negative.    Cardiovascular: Negative.   Musculoskeletal: Negative.   Skin:  Positive for rash.   Allergic/Immunologic: Negative.   Neurological: Negative.     All other systems negative unless noted above in HPI  Past medical history: Past Medical History:  Diagnosis Date   Anxiety    takin Fluoxetine   Arthritis    Dry skin    "winter itch", using Kenalog cream   GERD (gastroesophageal reflux disease)    no longer taking meds, elevates HOB   Grover's disease    Infection of prosthetic right knee joint [facility])     Past surgical history: Past Surgical History:  Procedure Laterality Date   BACK SURGERY  01/20/1989   lumbar surgery   COLONOSCOPY     hamstring surgery  01/21/1956   to stretch hamstring   HIP SURGERY Right    I & D KNEE WITH POLY EXCHANGE Right 04/23/2013   Procedure: IRRIGATION AND DEBRIDEMENT KNEE WITH POLY EXCHANGE;  Surgeon: Vickey Huger, MD;  Location: [facility];  Service: Orthopedics;  Laterality: Right;   MEDIAL PARTIAL KNEE REPLACEMENT Right 04/06/2013   SHOULDER SURGERY Left    VASECTOMY      Family history:  Family History  Problem Relation Age of Onset   Alzheimer's disease Mother    Hypertension Mother    Alzheimer's disease Father     Social history: Lives in a home without carpeting with wood and heat pump heating  and heat pump cooling.  Dog in the home.  Chickens outside the home.  No concern for water damage, mildew or roaches in the home.  He is retired.  Denies smoking history.    Medication List: Current Outpatient Medications  Medication Sig Dispense Refill   Ascorbic Acid (VITAMIN C) 100 MG tablet Take 200 mg by mouth daily.     Cholecalciferol (VITAMIN D3) 50 MCG (2000 UT) TABS Take by mouth.     ELDERBERRY PO Take by mouth.     FLUoxetine (PROZAC) 40 MG capsule Take 40 mg by mouth daily.     hydrOXYzine (ATARAX) 25 MG tablet Take 25 mg by mouth daily as needed.     Maca Root (MACA PO) Take by mouth.     MILK THISTLE PO Take by mouth.     triamcinolone cream (KENALOG) 0.1 % Apply topically 2 (two) times daily as needed.      No current facility-administered medications for this visit.    Known medication allergies: No Known Allergies   Physical examination: Blood pressure (!) 150/82, pulse 80, resp. rate 18, height 5\' 10"  (1.778 m), weight 165 lb (74.8 kg), SpO2 97 %.  General: Alert, interactive, in no acute distress. HEENT: PERRLA, TMs pearly gray, turbinates non-edematous without discharge, post-pharynx non erythematous. Neck: Supple without lymphadenopathy. Lungs: Clear to auscultation without wheezing, rhonchi or rales. {no increased work of breathing. CV: Normal S1, S2 without murmurs. Abdomen: Nondistended, nontender. Skin: Warm and dry, without lesions or rashes. Extremities:  No clubbing, cyanosis or edema. Neuro:   Grossly intact.  Diagnositics/Labs: None today  Assessment and plan: Chronic urticaria Pruritus   - at this time etiology of hives and swelling is unknown.  Hives can be caused by a variety of different triggers including illness/infection, foods, medications, stings, exercise, pressure, vibrations, extremes of temperature to name a few however majority of the time there is no identifiable trigger.  Your symptoms have been ongoing for >6 weeks making this chronic thus will obtain labwork to evaluate: CBC w diff, CMP, tryptase, hive panel, environmental panel, alpha-gal panel  - if the hives returns recommend taking primary antihistamine, Zyrtec 10mg  1 tab daily with secondary antihistamine, Pepcid 20mg  1 tab daily. If daily dosing is not effective enough then increase both medications to twice a day dosing.   - reserve Hydroxyzine 25mg  for as needed use for itch before bedtime  - should significant symptoms recur or new symptoms occur, a journal is to be kept recording any foods eaten, beverages consumed, medications taken, activities performed, and environmental conditions within a 6 hour time period prior to the onset of symptoms.   Continue dermatology care for nummular  dermatitis  Follow-up in 2-3 months or sooner if needed    I appreciate the opportunity to take part in Latravion's care. Please do not hesitate to contact me with questions.  Sincerely,   Prudy Feeler, MD Allergy/Immunology [facility]

## 2022-04-16 LAB — COMPREHENSIVE METABOLIC PANEL
ALT: 29 IU/L (ref 0–44)
BUN/Creatinine Ratio: 12 (ref 10–24)
Globulin, Total: 2 g/dL (ref 1.5–4.5)

## 2022-04-16 LAB — ALLERGENS W/TOTAL IGE AREA 2

## 2022-04-16 LAB — CBC WITH DIFFERENTIAL/PLATELET
Basos: 1 %
Eos: 5 %
Hematocrit: 46.3 % (ref 37.5–51.0)
Lymphocytes Absolute: 1 10*3/uL (ref 0.7–3.1)
MCHC: 34.8 g/dL (ref 31.5–35.7)
RBC: 5.01 x10E6/uL (ref 4.14–5.80)

## 2022-04-16 LAB — ALPHA-GAL PANEL

## 2022-04-16 LAB — CHRONIC URTICARIA

## 2022-04-17 LAB — ALLERGENS W/TOTAL IGE AREA 2

## 2022-04-17 LAB — CBC WITH DIFFERENTIAL/PLATELET
EOS (ABSOLUTE): 0.3 10*3/uL (ref 0.0–0.4)
MCV: 92 fL (ref 79–97)
Neutrophils: 69 %
Platelets: 315 10*3/uL (ref 150–450)

## 2022-04-17 LAB — COMPREHENSIVE METABOLIC PANEL: eGFR: 97 mL/min/{1.73_m2} (ref >59)

## 2022-04-17 LAB — ALPHA-GAL PANEL

## 2022-04-17 LAB — TSH+FREE T4: Free T4: 1.03 ng/dL (ref 0.82–1.77)

## 2022-04-20 LAB — CBC WITH DIFFERENTIAL/PLATELET
Basophils Absolute: 0 10*3/uL (ref 0.0–0.2)
Hemoglobin: 16.1 g/dL (ref 13.0–17.7)

## 2022-04-20 LAB — ALPHA-GAL PANEL

## 2022-04-20 LAB — ALLERGENS W/TOTAL IGE AREA 2
Aspergillus Fumigatus IgE: <0.1 kU/L
Cedar, Mountain IgE: <0.1 kU/L
Johnson Grass IgE: <0.1 kU/L
Pecan, Hickory IgE: <0.1 kU/L

## 2022-04-20 LAB — THYROID ANTIBODIES: Thyroperoxidase Ab SerPl-aCnc: 24 IU/mL (ref 0–34)

## 2022-04-20 LAB — COMPREHENSIVE METABOLIC PANEL
BUN: 9 mg/dL (ref 8–27)
Calcium: 9.5 mg/dL (ref 8.6–10.2)
Glucose: 106 mg/dL — ABNORMAL HIGH (ref 70–99)

## 2022-04-21 LAB — COMPREHENSIVE METABOLIC PANEL
AST: 25 IU/L (ref 0–40)
Alkaline Phosphatase: 85 IU/L (ref 44–121)
Bilirubin Total: 0.6 mg/dL (ref 0.0–1.2)
Total Protein: 6.3 g/dL (ref 6.0–8.5)

## 2022-04-21 LAB — CBC WITH DIFFERENTIAL/PLATELET
Immature Granulocytes: 0 %
MCH: 32.1 pg (ref 26.6–33.0)
Monocytes Absolute: 0.4 10*3/uL (ref 0.1–0.9)
Monocytes: 7 %
Neutrophils Absolute: 3.7 10*3/uL (ref 1.4–7.0)
RDW: 13.5 % (ref 11.6–15.4)

## 2022-04-21 LAB — TSH+FREE T4: TSH: 2.59 u[IU]/mL (ref 0.450–4.500)

## 2022-04-21 LAB — ALLERGENS W/TOTAL IGE AREA 2
Alternaria Alternata IgE: <0.1 kU/L
Cockroach, German IgE: 0.11 kU/L — AB
Cottonwood IgE: <0.1 kU/L
D Pteronyssinus IgE: 0.23 kU/L — AB
Maple/Box Elder IgE: <0.1 kU/L
Penicillium Chrysogen IgE: <0.1 kU/L

## 2022-04-22 ENCOUNTER — Encounter: Payer: Self-pay | Admitting: Allergy

## 2022-04-22 ENCOUNTER — Ambulatory Visit: Payer: Medicare Other | Admitting: Allergy

## 2022-04-22 VITALS — BP 160/90 | HR 81 | Resp 16

## 2022-04-22 DIAGNOSIS — L508 Other urticaria: Secondary | ICD-10-CM | POA: Diagnosis not present

## 2022-04-22 DIAGNOSIS — Z91018 Allergy to other foods: Secondary | ICD-10-CM | POA: Diagnosis not present

## 2022-04-22 DIAGNOSIS — L299 Pruritus, unspecified: Secondary | ICD-10-CM

## 2022-04-22 MED ORDER — EPINEPHRINE 0.3 MG/0.3ML IJ SOAJ: 0.3000 mg | INTRAMUSCULAR | 1 refills | Status: DC | PRN

## 2022-04-22 NOTE — Progress Notes (Signed)
Follow-up Note  RE: Ricky Davis MRN: [id number] DOB: [birth year] Date of Office Visit: 04/22/2022   History of present illness: Ricky Davis is a 72 y.o. male presenting today for follow-up of chronic urticaria and pruritus.  Also wants to discuss his lab results today.  He presents today with his wife.  He was last seen in the office on 04/15/22 by myself.  He states he had a large welt develop on his thigh yesterday morning.  States he woke up and the hive was there.  It went away in about 30 minutes thus did not take any medications for this.  He hadn't had any hives in about 2 weeks or so until yesterday.  He states he did have a hamburger for dinner Sunday night.    Review of systems: Review of Systems  Constitutional: Negative.   HENT: Negative.    Eyes: Negative.   Respiratory: Negative.    Cardiovascular: Negative.   Musculoskeletal: Negative.   Skin:  Positive for rash.  Allergic/Immunologic: Negative.   Neurological: Negative.      All other systems negative unless noted above in HPI  Past medical/social/surgical/family history have been reviewed and are unchanged unless specifically indicated below.  No changes  Medication List: Current Outpatient Medications  Medication Sig Dispense Refill   Ascorbic Acid (VITAMIN C) 100 MG tablet Take 200 mg by mouth daily.     Cholecalciferol (VITAMIN D3) 50 MCG (2000 UT) TABS Take by mouth.     ELDERBERRY PO Take by mouth.     FLUoxetine (PROZAC) 40 MG capsule Take 40 mg by mouth daily.     hydrOXYzine (ATARAX) 25 MG tablet Take 25 mg by mouth daily as needed.     Maca Root (MACA PO) Take by mouth.     MILK THISTLE PO Take by mouth.     triamcinolone cream (KENALOG) 0.1 % Apply topically 2 (two) times daily as needed.     No current facility-administered medications for this visit.     Known medication allergies: No Known Allergies   Physical examination (limited): Blood pressure (!) 160/90, pulse 81, resp. rate  16, SpO2 98 %.  General: Alert, interactive, in no acute distress. Lungs: Clear to auscultation without wheezing, rhonchi or rales. {no increased work of breathing. CV: Normal S1, S2 without murmurs. Abdomen: Nondistended, nontender. Skin: Warm and dry, without lesions or rashes. Extremities:  No clubbing, cyanosis or edema. Neuro:   Grossly intact.  Diagnositics/Labs: Labs:  Component     Latest Ref Rng 04/15/2022  D Pteronyssinus IgE     Class 0/I kU/L 0.23 !   D Farinae IgE     Class 0/I kU/L 0.18 !   Cat Dander IgE     Class 0 kU/L <0.10   Dog Dander IgE     Class 0 kU/L <0.10   [place] Grass IgE     Class 0 kU/L <0.10   Timothy Grass IgE     Class 0 kU/L <0.10   Johnson Grass IgE     Class 0 kU/L <0.10   Cockroach, German IgE     Class 0/I kU/L 0.11 !   Penicillium Chrysogen IgE     Class 0 kU/L <0.10   Cladosporium Herbarum IgE     Class 0 kU/L <0.10   Aspergillus Fumigatus IgE     Class 0 kU/L <0.10   Alternaria Alternata IgE     Class 0 kU/L <0.10   Maple/Box Elder IgE  Class 0 kU/L <0.10   Common Silver Wendee Copp IgE     Class 0 kU/L <0.10   Cedar, [place] IgE     Class 0 kU/L <0.10   Oak, White IgE     Class 0 kU/L <0.10   Elm, American IgE     Class 0 kU/L <0.10   [place] IgE     Class 0 kU/L <0.10   Pecan, Hickory IgE     Class 0 kU/L <0.10   White Mulberry IgE     Class 0 kU/L <0.10   Ragweed, Short IgE     Class 0 kU/L <0.10   Pigweed, Rough IgE     Class 0 kU/L <0.10   Sheep Sorrel IgE Qn     Class 0 kU/L <0.10   Mouse Urine IgE     Class 0 kU/L <0.10   WBC     3.4 - 10.8 x10E3/uL 5.4   RBC     4.14 - 5.80 x10E6/uL 5.01   Hemoglobin     13.0 - 17.7 g/dL 16.1   HCT     37.5 - 51.0 % 46.3   MCV     79 - 97 fL 92   MCH     26.6 - 33.0 pg 32.1   MCHC     31.5 - 35.7 g/dL 34.8   RDW     11.6 - 15.4 % 13.5   Platelets     150 - 450 x10E3/uL 315   Neutrophils     Not Estab. % 69   Lymphs     Not Estab. % 18   Monocytes      Not Estab. % 7   Eos     Not Estab. % 5   Basos     Not Estab. % 1   NEUT#     1.4 - 7.0 x10E3/uL 3.7   Lymphocyte #     0.7 - 3.1 x10E3/uL 1.0   Monocytes Absolute     0.1 - 0.9 x10E3/uL 0.4   EOS (ABSOLUTE)     0.0 - 0.4 x10E3/uL 0.3   Basophils Absolute     0.0 - 0.2 x10E3/uL 0.0   Immature Granulocytes     Not Estab. % 0   Immature Grans (Abs)     0.0 - 0.1 x10E3/uL 0.0   Glucose     70 - 99 mg/dL 106 (H)   BUN     8 - 27 mg/dL 9   Creatinine     0.76 - 1.27 mg/dL 0.73 (L)   eGFR     >59 mL/min/1.73 97   BUN/Creatinine Ratio     10 - 24  12   Sodium     134 - 144 mmol/L 140   Potassium     3.5 - 5.2 mmol/L 4.7   Chloride     96 - 106 mmol/L 100   CO2     20 - 29 mmol/L 23   Calcium     8.6 - 10.2 mg/dL 9.5   Total Protein     6.0 - 8.5 g/dL 6.3   Albumin     3.8 - 4.8 g/dL 4.3   Globulin, Total     1.5 - 4.5 g/dL 2.0   Albumin/Globulin Ratio     1.2 - 2.2  2.2   Total Bilirubin     0.0 - 1.2 mg/dL 0.6   Alkaline Phosphatase     44 - 121 IU/L 85  AST     0 - 40 IU/L 25   ALT     0 - 44 IU/L 29   Class Description Allergens Comment   IgE (Immunoglobulin E), Serum     6 - 495 IU/mL 95   [id number] Alpha-Gal     Class II kU/L 0.93 !   Beef IgE     Class 0/I kU/L 0.28 !   Pork IgE     Class 0/I kU/L 0.20 !   Allergen Lamb IgE     Class 0/I kU/L 0.17 !   Thyroperoxidase Ab SerPl-aCnc     0 - 34 IU/mL 24   Thyroglobulin Antibody     0.0 - 0.9 IU/mL <1.0   TSH     0.450 - 4.500 uIU/mL 2.590   T4,Free(Direct)     0.82 - 1.77 ng/dL 1.03   Tryptase     2.2 - 13.2 ug/L 5.3   cu index Comment (P)     Assessment and plan: Urticaria Pruritus Alpha gal allergy    - at this time etiology of hives most likely has a food allergy component.  Hives can be caused by a variety of different triggers including illness/infection, foods, medications, stings, exercise, pressure, vibrations, extremes of temperature to name a few however majority of the time  there is no identifiable trigger.   - labwork that has returned and pertinent positive discussed today: Positive alpha gal panel which is the red meat allergy testing.   Would avoid all red meat products in the diet and have access to epinephrine device in case of allergic reaction.  Follow emergency action plan in case of reaction.  Environmental allergy panel shows low IgE levels to dust mites and cockroach.  Allergen avoidance measures provided.  - if the hives returns and are consistent then recommend taking primary antihistamine, Zyrtec 10mg  1 tab daily with secondary antihistamine, Pepcid 20mg  1 tab daily. If daily dosing is not effective enough then increase both medications to twice a day dosing.   - reserve Hydroxyzine 25mg  for as needed use for itch before bedtime  - should significant symptoms recur or new symptoms occur, a journal is to be kept recording any foods eaten, beverages consumed, medications taken, activities performed, and environmental conditions within a 6 hour time period prior to the onset of symptoms.   Follow-up in 4-6 months or sooner if needed  I appreciate the opportunity to take part in Ricky Davis's care. Please do not hesitate to contact me with questions.  Sincerely,   Prudy Feeler, MD Allergy/[facility]ology [facility]

## 2022-04-22 NOTE — Patient Instructions (Addendum)
- .  Hives can be caused by a variety of different triggers including illness/infection, foods, medications, stings, exercise, pressure, vibrations, extremes of temperature to name a few however majority of the time there is no identifiable trigger.   - labwork that has returned shows:   Positive alpha gal panel which is the red meat allergy testing.   Would avoid all red meat products in the diet and have access to epinephrine device in case of allergic reaction.  Follow emergency action plan in case of reaction.  Environmental allergy panel shows low IgE levels to dust mites and cockroach.  Allergen avoidance measures provided.  - if the hives returns and are consistent then recommend taking primary antihistamine, Zyrtec 10mg  1 tab daily with secondary antihistamine, Pepcid 20mg  1 tab daily. If daily dosing is not effective enough then increase both medications to twice a day dosing.   - reserve Hydroxyzine 25mg  for as needed use for itch before bedtime  - should significant symptoms recur or new symptoms occur, a journal is to be kept recording any foods eaten, beverages consumed, medications taken, activities performed, and environmental conditions within a 6 hour time period prior to the onset of symptoms.   Follow-up in 4-6 months or sooner if needed

## 2022-04-23 LAB — ALLERGENS W/TOTAL IGE AREA 2
Cat Dander IgE: <0.1 kU/L
Oak, White IgE: <0.1 kU/L
Pigweed, Rough IgE: <0.1 kU/L
Ragweed, Short IgE: <0.1 kU/L
Sheep Sorrel IgE Qn: <0.1 kU/L
White Mulberry IgE: <0.1 kU/L

## 2022-04-23 LAB — COMPREHENSIVE METABOLIC PANEL
Albumin/Globulin Ratio: 2.2 (ref 1.2–2.2)
Albumin: 4.3 g/dL (ref 3.8–4.8)
CO2: 23 mmol/L (ref 20–29)
Chloride: 100 mmol/L (ref 96–106)
Creatinine, Ser: 0.73 mg/dL — ABNORMAL LOW (ref 0.76–1.27)
Potassium: 4.7 mmol/L (ref 3.5–5.2)
Sodium: 140 mmol/L (ref 134–144)

## 2022-04-23 LAB — THYROID ANTIBODIES: Thyroglobulin Antibody: <1 IU/mL (ref 0.0–0.9)

## 2022-04-23 LAB — TRYPTASE: Tryptase: 5.3 ug/L (ref 2.2–13.2)

## 2022-04-23 LAB — CBC WITH DIFFERENTIAL/PLATELET
Immature Grans (Abs): 0 10*3/uL (ref 0.0–0.1)
Lymphs: 18 %
WBC: 5.4 10*3/uL (ref 3.4–10.8)

## 2022-04-28 DIAGNOSIS — M25552 Pain in left hip: Secondary | ICD-10-CM | POA: Diagnosis not present

## 2022-04-28 DIAGNOSIS — G8929 Other chronic pain: Secondary | ICD-10-CM | POA: Diagnosis not present

## 2022-04-28 DIAGNOSIS — Z6822 Body mass index (BMI) 22.0-22.9, adult: Secondary | ICD-10-CM | POA: Diagnosis not present

## 2022-05-03 DIAGNOSIS — S61214A Laceration without foreign body of right ring finger without damage to nail, initial encounter: Secondary | ICD-10-CM | POA: Diagnosis not present

## 2022-05-22 DIAGNOSIS — M1612 Unilateral primary osteoarthritis, left hip: Secondary | ICD-10-CM | POA: Diagnosis not present

## 2022-05-22 DIAGNOSIS — M25552 Pain in left hip: Secondary | ICD-10-CM | POA: Diagnosis not present

## 2022-06-18 DIAGNOSIS — M25552 Pain in left hip: Secondary | ICD-10-CM | POA: Diagnosis not present

## 2022-06-18 DIAGNOSIS — M25652 Stiffness of left hip, not elsewhere classified: Secondary | ICD-10-CM | POA: Diagnosis not present

## 2022-06-19 DIAGNOSIS — Z9189 Other specified personal risk factors, not elsewhere classified: Secondary | ICD-10-CM | POA: Diagnosis not present

## 2022-06-19 DIAGNOSIS — R030 Elevated blood-pressure reading, without diagnosis of hypertension: Secondary | ICD-10-CM | POA: Diagnosis not present

## 2022-07-02 DIAGNOSIS — L508 Other urticaria: Secondary | ICD-10-CM | POA: Diagnosis not present

## 2022-07-02 DIAGNOSIS — Z96642 Presence of left artificial hip joint: Secondary | ICD-10-CM | POA: Diagnosis not present

## 2022-07-02 DIAGNOSIS — R030 Elevated blood-pressure reading, without diagnosis of hypertension: Secondary | ICD-10-CM | POA: Diagnosis not present

## 2022-07-02 DIAGNOSIS — D62 Acute posthemorrhagic anemia: Secondary | ICD-10-CM | POA: Diagnosis not present

## 2022-07-02 DIAGNOSIS — Z471 Aftercare following joint replacement surgery: Secondary | ICD-10-CM | POA: Diagnosis not present

## 2022-07-02 DIAGNOSIS — Z79899 Other long term (current) drug therapy: Secondary | ICD-10-CM | POA: Diagnosis not present

## 2022-07-02 DIAGNOSIS — M1612 Unilateral primary osteoarthritis, left hip: Secondary | ICD-10-CM | POA: Diagnosis not present

## 2022-07-02 DIAGNOSIS — G8918 Other acute postprocedural pain: Secondary | ICD-10-CM | POA: Diagnosis not present

## 2022-07-15 DIAGNOSIS — Z471 Aftercare following joint replacement surgery: Secondary | ICD-10-CM | POA: Diagnosis not present

## 2022-07-15 DIAGNOSIS — Z96642 Presence of left artificial hip joint: Secondary | ICD-10-CM | POA: Diagnosis not present

## 2022-08-06 DIAGNOSIS — Z471 Aftercare following joint replacement surgery: Secondary | ICD-10-CM | POA: Diagnosis not present

## 2022-08-06 DIAGNOSIS — Z96642 Presence of left artificial hip joint: Secondary | ICD-10-CM | POA: Diagnosis not present

## 2022-09-16 ENCOUNTER — Ambulatory Visit: Payer: Medicare Other | Admitting: Allergy

## 2022-09-16 ENCOUNTER — Encounter: Payer: Self-pay | Admitting: Allergy

## 2022-09-16 VITALS — BP 138/82 | HR 90 | Resp 18

## 2022-09-16 DIAGNOSIS — L508 Other urticaria: Secondary | ICD-10-CM

## 2022-09-16 DIAGNOSIS — Z91018 Allergy to other foods: Secondary | ICD-10-CM | POA: Diagnosis not present

## 2022-09-16 DIAGNOSIS — L299 Pruritus, unspecified: Secondary | ICD-10-CM | POA: Diagnosis not present

## 2022-09-16 MED ORDER — EPINEPHRINE 0.3 MG/0.3ML IJ SOAJ: 0.3000 mg | INTRAMUSCULAR | 1 refills | Status: AC | PRN

## 2022-09-16 NOTE — Progress Notes (Signed)
Follow-up Note  RE: Ricky Davis MRN: [id number] DOB: January 29, 1950 Date of Office Visit: 09/16/2022   History of present illness: Ricky Davis is a 72 y.o. male presenting today for follow-up of urticaria, pruritus with alpha gal allergy.  He was last seen in the office on 04/22/2022.    He states he is not as bad as he was last year, earlier this year with the hives.   He states he still eats red meat and states sometimes he can have a steak and nothing happens and sometimes he can eat a burger and have a breakout on his arms. He states currently he has itching of his armpits.  He did change his deorderant and still notes itch but states does not itch daily.  He states he can have hives develop on his buttocks that comes and goes.  He states he consumes red meat about 3-4 times a week still.  He states he does eat a lot more chicken than he used to.  He does have access to epinephrine device.  His PCP has prescribed triamcinolone that does help in areas that itch.  He has hydroxyzine that he will use at night for itch as needed.    Review of systems: 10pt ROS negative unless noted above in HPI  Past medical/social/surgical/family history have been reviewed and are unchanged unless specifically indicated below.  No changes  Medication List: Current Outpatient Medications  Medication Sig Dispense Refill   Ascorbic Acid (VITAMIN C) 100 MG tablet Take 200 mg by mouth daily.     Cholecalciferol (VITAMIN D3) 50 MCG (2000 UT) TABS Take by mouth.     ELDERBERRY PO Take by mouth.     FLUoxetine (PROZAC) 40 MG capsule Take 40 mg by mouth daily.     hydrOXYzine (ATARAX) 25 MG tablet Take 25 mg by mouth daily as needed.     Maca Root (MACA PO) Take by mouth.     MILK THISTLE PO Take by mouth.     triamcinolone cream (KENALOG) 0.1 % Apply topically 2 (two) times daily as needed.     EPINEPHrine (EPIPEN 2-PAK) 0.3 mg/0.3 mL IJ SOAJ injection Inject 0.3 mg into the muscle as needed for anaphylaxis.  2 each 1   No current facility-administered medications for this visit.     Known medication allergies: No Known Allergies   Physical examination: Blood pressure 138/82, pulse 90, resp. rate 18, SpO2 96%.  General: Alert, interactive, in no acute distress. HEENT: PERRLA, TMs pearly gray, turbinates non-edematous without discharge, post-pharynx non erythematous. Neck: Supple without lymphadenopathy. Lungs: Clear to auscultation without wheezing, rhonchi or rales. {no increased work of breathing. CV: Normal S1, S2 without murmurs. Abdomen: Nondistended, nontender. Skin: Scattered erythematous urticarial type lesions primarily located right forearm medially , nonvesicular. Extremities:  No clubbing, cyanosis or edema. Neuro:   Grossly intact.  Diagnositics/Labs: None today  Assessment and plan: Urticaria, pruritus Alpha-gal allergy   - Hives can be caused by a variety of different triggers including illness/infection, foods, medications, stings, exercise, pressure, vibrations, extremes of temperature to name a few however majority of the time there is no identifiable trigger.   - labwork that has returned shows:   Positive alpha gal panel which is the red meat allergy testing.   Would avoid all red meat products in the diet and have access to epinephrine device in case of allergic reaction.  Follow emergency action plan in case of reaction.  Continue avoidance measures for  dust mites and cockroach.  - if the hives returns and are consistent then recommend taking primary antihistamine, Zyrtec 10mg  1 tab daily with secondary antihistamine, Pepcid 20mg  1 tab daily. If daily dosing is not effective enough then increase both medications to twice a day dosing.   - reserve Hydroxyzine 25mg  for as needed use for itch before bedtime  - can use Triamcinolone ointment twice a day as needed for tich  - should significant symptoms recur or new symptoms occur, a journal is to be kept recording any  foods eaten, beverages consumed, medications taken, activities performed, and environmental conditions within a 6 hour time period prior to the onset of symptoms.   Follow-up in 12 months or sooner if needed  I appreciate the opportunity to take part in Ricky Davis's care. Please do not hesitate to contact me with questions.  Sincerely,   Margo Aye, MD Allergy/Immunology [facility]

## 2022-09-16 NOTE — Patient Instructions (Addendum)
-   Hives can be caused by a variety of different triggers including illness/infection, foods, medications, stings, exercise, pressure, vibrations, extremes of temperature to name a few however majority of the time there is no identifiable trigger.   - labwork that has returned shows:   Positive alpha gal panel which is the red meat allergy testing.   Would avoid all red meat products in the diet and have access to epinephrine device in case of allergic reaction.  Follow emergency action plan in case of reaction.  Continue avoidance measures for dust mites and cockroach.  - if the hives returns and are consistent then recommend taking primary antihistamine, Zyrtec 10mg  1 tab daily with secondary antihistamine, Pepcid 20mg  1 tab daily. If daily dosing is not effective enough then increase both medications to twice a day dosing.   - reserve Hydroxyzine 25mg  for as needed use for itch before bedtime  - can use Triamcinolone ointment twice a day as needed for tich  - should significant symptoms recur or new symptoms occur, a journal is to be kept recording any foods eaten, beverages consumed, medications taken, activities performed, and environmental conditions within a 6 hour time period prior to the onset of symptoms.   Follow-up in 12 months or sooner if needed

## 2022-09-17 DIAGNOSIS — Z91018 Allergy to other foods: Secondary | ICD-10-CM | POA: Diagnosis not present

## 2022-09-17 DIAGNOSIS — L508 Other urticaria: Secondary | ICD-10-CM | POA: Diagnosis not present

## 2022-09-17 DIAGNOSIS — L299 Pruritus, unspecified: Secondary | ICD-10-CM | POA: Diagnosis not present

## 2022-09-21 LAB — ALPHA-GAL PANEL
Allergen Lamb IgE: 0.12 kU/L — AB
Beef IgE: 0.21 kU/L — AB
IgE (Immunoglobulin E), Serum: 114 [IU]/mL (ref 6–495)
O215-IgE Alpha-Gal: 0.61 kU/L — AB
Pork IgE: <0.1 kU/L

## 2022-11-07 DIAGNOSIS — Z23 Encounter for immunization: Secondary | ICD-10-CM | POA: Diagnosis not present

## 2023-05-21 DIAGNOSIS — Z6823 Body mass index (BMI) 23.0-23.9, adult: Secondary | ICD-10-CM | POA: Diagnosis not present

## 2023-05-21 DIAGNOSIS — Z0000 Encounter for general adult medical examination without abnormal findings: Secondary | ICD-10-CM | POA: Diagnosis not present

## 2023-05-21 DIAGNOSIS — R202 Paresthesia of skin: Secondary | ICD-10-CM | POA: Diagnosis not present

## 2023-05-21 DIAGNOSIS — E7800 Pure hypercholesterolemia, unspecified: Secondary | ICD-10-CM | POA: Diagnosis not present

## 2023-05-21 DIAGNOSIS — Z79899 Other long term (current) drug therapy: Secondary | ICD-10-CM | POA: Diagnosis not present

## 2023-05-21 DIAGNOSIS — Z9181 History of falling: Secondary | ICD-10-CM | POA: Diagnosis not present

## 2023-06-17 DIAGNOSIS — S56219A Strain of other flexor muscle, fascia and tendon at forearm level, unspecified arm, initial encounter: Secondary | ICD-10-CM | POA: Diagnosis not present

## 2023-09-15 ENCOUNTER — Ambulatory Visit: Payer: Medicare Other | Admitting: Allergy

## 2023-09-15 ENCOUNTER — Encounter: Payer: Self-pay | Admitting: Allergy

## 2023-09-15 VITALS — BP 122/80 | HR 80 | Resp 18

## 2023-09-15 DIAGNOSIS — Z91018 Allergy to other foods: Secondary | ICD-10-CM

## 2023-09-15 DIAGNOSIS — L299 Pruritus, unspecified: Secondary | ICD-10-CM

## 2023-09-15 DIAGNOSIS — L508 Other urticaria: Secondary | ICD-10-CM | POA: Diagnosis not present

## 2023-09-15 NOTE — Patient Instructions (Addendum)
 Doing well over the past year without hives and able to consume red meat products now without issue Will obtain updated alpha gal panel and is negative then you do not need to continue access to epipen  device.  If still positive would recommend access to epipen  in case of a reaction.   - if the hives returns and are consistent then recommend taking primary antihistamine, Zyrtec 10mg  1 tab daily with secondary antihistamine, Pepcid 20mg  1 tab daily. If daily dosing is not effective enough then increase both medications to twice a day dosing.   - can use Triamcinolone  ointment twice a day as needed for tich  - should significant symptoms recur or new symptoms occur, a journal is to be kept recording any foods eaten, beverages consumed, medications taken, activities performed, and environmental conditions within a 6 hour time period prior to the onset of symptoms.   Follow-up as needed

## 2023-09-15 NOTE — Progress Notes (Signed)
 Follow-up Note  RE: Ricky Davis MRN: [id number] DOB: 1950-03-24 Date of Office Visit: 09/15/2023   History of present illness: Ricky Davis is a 73 y.o. male presenting today for follow-up of urticaria and alpha gal allergy.  He was last seen in the office on 09/16/22 by myself.   Discussed the use of AI scribe software for clinical note transcription with the patient, who gave verbal consent to proceed.  He has been able to consume red meat without experiencing any allergic reactions such as welts or itching for the past six months to a year. His skin condition has improved, feeling softer, and he continues to carry an EpiPen , although he has never used it.  His alpha-gal panel from last year remained positive but showed a decrease compared to the previous year.  He is not taking any allergy medications like Zyrtec and only takes fluoxetine  and vitamins.  Family history includes his son having an EpiPen  for bee sting allergies. He also mentions his wife and one of his children having EpiPens for other reasons.  Review of systems: 10pt ROS negative unless noted above in HPI   Past medical/social/surgical/family history have been reviewed and are unchanged unless specifically indicated below.  No changes  Medication List: Current Outpatient Medications  Medication Sig Dispense Refill   Ascorbic Acid (VITAMIN C) 100 MG tablet Take 200 mg by mouth daily.     Cholecalciferol (VITAMIN D3) 50 MCG (2000 UT) TABS Take by mouth.     ELDERBERRY PO Take by mouth.     EPINEPHrine  (EPIPEN  2-PAK) 0.3 mg/0.3 mL IJ SOAJ injection Inject 0.3 mg into the muscle as needed for anaphylaxis. 2 each 1   FLUoxetine  (PROZAC ) 40 MG capsule Take 40 mg by mouth daily.     Maca Root (MACA PO) Take by mouth.     MILK THISTLE PO Take by mouth.     No current facility-administered medications for this visit.     Known medication allergies: No Known Allergies   Physical examination: Blood pressure  122/80, pulse 80, resp. rate 18, SpO2 96%.  General: Alert, interactive, in no acute distress. HEENT: PERRLA, TMs pearly gray, turbinates non-edematous without discharge, post-pharynx non erythematous. Neck: Supple without lymphadenopathy. Lungs: Clear to auscultation without wheezing, rhonchi or rales. {no increased work of breathing. CV: Normal S1, S2 without murmurs. Abdomen: Nondistended, nontender. Skin: Warm and dry, without lesions or rashes. Extremities:  No clubbing, cyanosis or edema. Neuro:   Grossly intact.  Diagnostics/Labs: None today  Assessment and plan: Urticaria with pruritus - resolved History of food allergy- alpha gal   Doing well over the past year without hives and able to consume red meat products now without issue Will obtain updated alpha gal panel and is negative then you do not need to continue access to epipen  device.  If still positive would recommend access to epipen  in case of a reaction.   - if the hives returns and are consistent then recommend taking primary antihistamine, Zyrtec 10mg  1 tab daily with secondary antihistamine, Pepcid 20mg  1 tab daily. If daily dosing is not effective enough then increase both medications to twice a day dosing.   - can use Triamcinolone  ointment twice a day as needed for tich  - should significant symptoms recur or new symptoms occur, a journal is to be kept recording any foods eaten, beverages consumed, medications taken, activities performed, and environmental conditions within a 6 hour time period prior to the onset of symptoms.  Follow-up as needed  I appreciate the opportunity to take part in Theador's care. Please do not hesitate to contact me with questions.  Sincerely,   Danita Brain, MD Allergy/Immunology [facility]

## 2023-09-18 LAB — ALPHA-GAL PANEL
Allergen Lamb IgE: 0.57 kU/L — AB
Beef IgE: 1.25 kU/L — AB
IgE (Immunoglobulin E), Serum: 115 [IU]/mL (ref 6–495)
O215-IgE Alpha-Gal: 2.66 kU/L — AB
Pork IgE: 0.32 kU/L — AB

## 2023-09-25 ENCOUNTER — Ambulatory Visit: Payer: Self-pay | Admitting: Allergy

## 2023-11-11 DIAGNOSIS — Z23 Encounter for immunization: Secondary | ICD-10-CM | POA: Diagnosis not present
